# Patient Record
Sex: Female | Born: 1970
Health system: Southern US, Community
[De-identification: ages and names within clinical notes are randomized; demographics above are authoritative.]

## PROBLEM LIST (undated history)

## (undated) DIAGNOSIS — E079 Disorder of thyroid, unspecified: Secondary | ICD-10-CM

## (undated) DIAGNOSIS — E039 Hypothyroidism, unspecified: Secondary | ICD-10-CM

## (undated) DIAGNOSIS — D219 Benign neoplasm of connective and other soft tissue, unspecified: Secondary | ICD-10-CM

## (undated) DIAGNOSIS — I10 Essential (primary) hypertension: Secondary | ICD-10-CM

## (undated) HISTORY — DX: Hypothyroidism, unspecified: E03.9

## (undated) HISTORY — DX: Disorder of thyroid, unspecified: E07.9

## (undated) HISTORY — PX: KNEE ARTHROSCOPY: SUR90

## (undated) HISTORY — DX: Essential (primary) hypertension: I10

## (undated) HISTORY — PX: TONSILLECTOMY: SUR1361

## (undated) HISTORY — PX: DILATION AND CURETTAGE OF UTERUS: SHX78

---

## 1997-11-23 ENCOUNTER — Other Ambulatory Visit: Admission: RE | Admit: 1997-11-23 | Discharge: 1997-11-23 | Payer: Self-pay | Admitting: Obstetrics and Gynecology

## 1998-11-28 ENCOUNTER — Other Ambulatory Visit: Admission: RE | Admit: 1998-11-28 | Discharge: 1998-11-28 | Payer: Self-pay | Admitting: Obstetrics and Gynecology

## 2000-01-16 ENCOUNTER — Encounter: Admission: RE | Admit: 2000-01-16 | Discharge: 2000-01-16 | Payer: Self-pay | Admitting: Family Medicine

## 2000-01-16 ENCOUNTER — Encounter: Payer: Self-pay | Admitting: Family Medicine

## 2000-05-14 ENCOUNTER — Other Ambulatory Visit: Admission: RE | Admit: 2000-05-14 | Discharge: 2000-05-14 | Payer: Self-pay | Admitting: Gynecology

## 2001-01-19 ENCOUNTER — Inpatient Hospital Stay (HOSPITAL_COMMUNITY): Admission: AD | Admit: 2001-01-19 | Discharge: 2001-01-21 | Payer: Self-pay | Admitting: *Deleted

## 2001-01-24 ENCOUNTER — Encounter: Admission: RE | Admit: 2001-01-24 | Discharge: 2001-02-23 | Payer: Self-pay | Admitting: *Deleted

## 2001-02-28 ENCOUNTER — Other Ambulatory Visit: Admission: RE | Admit: 2001-02-28 | Discharge: 2001-02-28 | Payer: Self-pay | Admitting: *Deleted

## 2002-04-03 ENCOUNTER — Other Ambulatory Visit: Admission: RE | Admit: 2002-04-03 | Discharge: 2002-04-03 | Payer: Self-pay | Admitting: *Deleted

## 2002-04-21 ENCOUNTER — Encounter: Payer: Self-pay | Admitting: Family Medicine

## 2002-04-21 ENCOUNTER — Encounter: Admission: RE | Admit: 2002-04-21 | Discharge: 2002-04-21 | Payer: Self-pay | Admitting: Family Medicine

## 2002-08-27 ENCOUNTER — Encounter: Admission: RE | Admit: 2002-08-27 | Discharge: 2002-11-25 | Payer: Self-pay | Admitting: Family Medicine

## 2002-11-17 ENCOUNTER — Other Ambulatory Visit: Admission: RE | Admit: 2002-11-17 | Discharge: 2002-11-17 | Payer: Self-pay | Admitting: Obstetrics and Gynecology

## 2003-05-26 ENCOUNTER — Inpatient Hospital Stay (HOSPITAL_COMMUNITY): Admission: AD | Admit: 2003-05-26 | Discharge: 2003-05-28 | Payer: Self-pay | Admitting: Obstetrics and Gynecology

## 2003-05-29 ENCOUNTER — Inpatient Hospital Stay (HOSPITAL_COMMUNITY): Admission: AD | Admit: 2003-05-29 | Discharge: 2003-05-29 | Payer: Self-pay | Admitting: Obstetrics and Gynecology

## 2003-05-31 ENCOUNTER — Encounter: Admission: RE | Admit: 2003-05-31 | Discharge: 2003-06-30 | Payer: Self-pay | Admitting: Obstetrics and Gynecology

## 2003-06-21 ENCOUNTER — Encounter: Admission: RE | Admit: 2003-06-21 | Discharge: 2003-09-19 | Payer: Self-pay | Admitting: Family Medicine

## 2003-07-13 ENCOUNTER — Other Ambulatory Visit: Admission: RE | Admit: 2003-07-13 | Discharge: 2003-07-13 | Payer: Self-pay | Admitting: Obstetrics and Gynecology

## 2003-12-29 ENCOUNTER — Encounter: Admission: RE | Admit: 2003-12-29 | Discharge: 2004-01-12 | Payer: Self-pay | Admitting: Family Medicine

## 2004-05-03 ENCOUNTER — Emergency Department (HOSPITAL_COMMUNITY): Admission: EM | Admit: 2004-05-03 | Discharge: 2004-05-03 | Payer: Self-pay | Admitting: Emergency Medicine

## 2004-07-20 ENCOUNTER — Other Ambulatory Visit: Admission: RE | Admit: 2004-07-20 | Discharge: 2004-07-20 | Payer: Self-pay | Admitting: Obstetrics and Gynecology

## 2005-03-28 ENCOUNTER — Other Ambulatory Visit: Admission: RE | Admit: 2005-03-28 | Discharge: 2005-03-28 | Payer: Self-pay | Admitting: Obstetrics and Gynecology

## 2005-04-25 ENCOUNTER — Inpatient Hospital Stay (HOSPITAL_COMMUNITY): Admission: AD | Admit: 2005-04-25 | Discharge: 2005-04-25 | Payer: Self-pay | Admitting: Obstetrics and Gynecology

## 2006-06-06 ENCOUNTER — Inpatient Hospital Stay (HOSPITAL_COMMUNITY): Admission: RE | Admit: 2006-06-06 | Discharge: 2006-06-08 | Payer: Self-pay | Admitting: Obstetrics and Gynecology

## 2008-02-24 ENCOUNTER — Ambulatory Visit: Payer: Self-pay | Admitting: Internal Medicine

## 2008-04-08 ENCOUNTER — Ambulatory Visit: Payer: Self-pay | Admitting: Internal Medicine

## 2008-11-01 ENCOUNTER — Ambulatory Visit: Payer: Self-pay | Admitting: Internal Medicine

## 2010-08-04 NOTE — Discharge Summary (Signed)
[facility]  Patient:    Jane Avila, Jane Avila Visit Number: [id number] MRN: [id number]          Service Type: OBS Location: 910A [id number] [id number] Attending Physician:  Wetzel Bjornstad Dictated by:   Antony Contras, [facility] Admit Date:  01/19/2001 Discharge Date: 01/21/2001                             Discharge Summary  DISCHARGE DIAGNOSES:          1. Intrauterine pregnancy at term.                               2. Spontaneous onset of labor.  PROCEDURE:                    Normal spontaneous vaginal delivery of viable infant over second degree episiotomy.  HISTORY OF PRESENT ILLNESS:   The patient is a 40 year old primigravida with an LMP of April 24, 2000, [facility] January 29, 2001.  Prenatal course was complicated by a borderline AFP.  She did have an amniocentesis which revealed a chromosomally normal fetus.  LABORATORY DATA:              Blood type B positive, antibody screen negative. RPR, HBSAG, and HIV nonreactive.  Tox screen negative.  HOSPITAL COURSE:              The patient presented on January 19, 2001, with spontaneous onset of labor.  She progressed to complete dilatation and delivered an Apgars 9 and 69 female infant weighing 7 pounds 7 ounces over midline episiotomy.  Postpartum course was uncomplicated.  She remained afebrile, had no difficulty voiding, and was able to be discharged on the second postpartum day in satisfactory condition.  CBC; hematocrit 32.5, hemoglobin 11.3, WBC 22.8, platelets 285.  DISPOSITION:                  Follow up in six weeks, continue prenatal vitamins and iron.  Motrin and Tylox for pain. Dictated by:   Antony Contras, [facility] Attending Physician:  Wetzel Bjornstad DD:  01/31/01 TD:  01/31/01 Job: [id number]

## 2010-08-04 NOTE — H&P (Signed)
Jane Avila, PICCININNI          ACCOUNT NO.:  [id number]   MEDICAL RECORD NO.:  [id number]          PATIENT TYPE:  INP   LOCATION:  9175                          FACILITY:  [facility]   PHYSICIAN:  Lenoard Aden, M.D.DATE OF BIRTH:  1970-10-27   DATE OF ADMISSION:  06/06/2006  DATE OF DISCHARGE:                              HISTORY & PHYSICAL   INDICATION FOR INDUCTION:  History of precipitous labor, advanced  cervical dilatation, history of second trimester termination for  congenital anomalies with last labor being approximately 20 minutes.   She is a 40 year old Hispanic female, G4, P2, at [redacted] weeks gestation who  presents with 5 cm dilatation, history of precipitous labor, for  induction.   PAST MEDICAL HISTORY:  1. Remarkable for 2 uncomplicated vaginal deliveries.  One second      trimester termination for anomalies.  One spontaneous abortion.  2. History of knee surgery.  3. Migraine headaches.   SOCIAL HISTORY:  She is a nonsmoker, nondrinker.  Denies domestic or  physical violence.   MEDICATIONS:  1. Prenatal vitamins.  2. Tums.  3. Maalox.  4. Zantac.   She was previously hypothyroid with normal TSH this pregnancy.  Currently on no medications.   FAMILY HISTORY:  Father with diabetes, hypertension and skin cancer.  Family history of urolithiasis.   PRENATAL COURSE:  Otherwise uncomplicated.   PHYSICAL EXAMINATION:  GENERAL:  She is a well-developed, well-nourished  Hispanic female in no acute distress.  HEENT:  Normal.  LUNGS:  Clear.  HEART:  Regular rhythm.  ABDOMEN:  Soft, gravid, nontender.  Estimated fetal weight 7 to 7.5  pounds.  PELVIC:  Cervix is 5 cm, 60%, vertex -1.  Amniotomy of clear fluid.  EXTREMITIES:  No cords.  NEUROLOGIC:  Nonfocal.  No CVA tenderness noted.  SKIN:  Intact.   NST is reactive.  Contractions are irregular at this time.   IMPRESSION:  1. A 38-week OB.  2. History of second trimester termination for multiple  anomalies.  3. History of precipitous labor.  4. Advanced cervical dilatation.   PLAN:  Proceed with induction.  Risks and benefits with small risk of  prematurity noted.  The patient acknowledges and will proceed.      Lenoard Aden, M.D.  Electronically Signed     RJT/MEDQ  D:  06/06/2006  T:  06/06/2006  Job:  [id number]

## 2010-08-04 NOTE — H&P (Signed)
[facility]  Patient:    Jane Avila, Jane Avila Visit Number: [id number] MRN: [id number]          Service Type: Attending:  Katy Fitch, M.D. Dictated by:   Katy Fitch, M.D. Adm. Date:  01/19/01                           History and Physical  CHIEF COMPLAINT:                Labor.  HISTORY OF PRESENT ILLNESS:     The patient is a 40 year old G1 at [redacted] weeks gestation who comes into triage complaining of contractions every three minutes for the past three hours.  The patient denies any fevers, vomiting, change in bowel or bladder habits or rupture of membranes.  The patient was checked in triage and noted to be 2-3 cm, 100% effaced and +2 station, contracting every two to three minutes on tocometer with a reactive strip in the 150s.  The patient was then admitted to labor and delivery.  PAST MEDICAL HISTORY:           None.  PAST SURGICAL HISTORY:          Knee surgery.  MEDICATIONS:                    Prenatal vitamins.  ALLERGIES:                      None.  SOCIAL HISTORY:                 She denies any alcohol, tobacco or drugs.  FAMILY HISTORY:                 Without any mental retardation or epithelial cancers.  PRENATAL LABORATORY DATA:       B positive, rubella immune.  PHYSICAL EXAMINATION:  VITAL SIGNS:                    Blood pressure 127/80.  HEENT:                          Clear.  LUNGS:                          Clear to auscultation bilaterally.  HEART:                          Regular rate and rhythm.  ABDOMEN:                        Gravid, nontender.  Estimated fetal weight 7 pounds, 8 ounces.  EXTREMITIES:                    Without clubbing, cyanosis, or tenderness.  PELVIC:                         Cervix 2-3, 100%, -2.  Tocolysis q.2-3.  Fetal heart tones 150s, reactive.  ASSESSMENT AND PLAN:            This is a 41 year old G1 at 17 weeks in latent labor.  We will admit for labor.  The patient will is  GBS negative and will not need penicillin.  We will give IV Stadol for  pain relief and then epidural as needed. Dictated by:   Katy Fitch, M.D. Attending:  Katy Fitch, M.D. DD:  01/19/01 TD:  01/19/01 Job: [id number]

## 2010-09-22 ENCOUNTER — Encounter: Payer: Self-pay | Admitting: Internal Medicine

## 2010-09-22 ENCOUNTER — Ambulatory Visit (INDEPENDENT_AMBULATORY_CARE_PROVIDER_SITE_OTHER): Payer: Commercial Managed Care - PPO | Admitting: Internal Medicine

## 2010-09-22 DIAGNOSIS — IMO0002 Reserved for concepts with insufficient information to code with codable children: Secondary | ICD-10-CM

## 2010-09-22 DIAGNOSIS — W278XXA Contact with other nonpowered hand tool, initial encounter: Secondary | ICD-10-CM

## 2010-09-22 DIAGNOSIS — F419 Anxiety disorder, unspecified: Secondary | ICD-10-CM

## 2010-09-22 DIAGNOSIS — E049 Nontoxic goiter, unspecified: Secondary | ICD-10-CM

## 2010-09-22 DIAGNOSIS — E039 Hypothyroidism, unspecified: Secondary | ICD-10-CM

## 2010-09-22 DIAGNOSIS — F411 Generalized anxiety disorder: Secondary | ICD-10-CM

## 2010-09-22 NOTE — Progress Notes (Signed)
  Subjective:    Patient ID: Jane Avila, female    DOB: 1970/04/23, 40 y.o.   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 Apparently went to put a used syringe with needle attached into the sharps container and accidently stuck herself superficially in the finger. There is some question about a possible exposure to Hepatitis C. Employee Health at [facility] and an Infectious Disease specialist in [facility] have been involved with the case. Jane Avila still has a number of questions about this incident. Unfortunately, Dr. Dorena Bodo Disease specialist, is on vacation this week and could not be reached. I did call Dr. Lollie Sails, Chief Medical Officer, who did investigate the matter. Jane Avila is to report next week for repeat testing. They are trying to contact the patient that was in the Emergency Department for some further testing as well. Total amount of time spent on this matter today was one hour. This included making multiple phone calls to [facility], Dr. Lollie Sails, [facility], and to Dr. Uvaldo Bristle office. Patient was counseled regarding her needlestick exposure. Currently she is having considerable anxiety regarding the issue. Have given her prescription for Xanax 0.25 mg #60 one half to one by mouth twice a day for anxiety. I later contacted her by telephone and relayed to her what  Dr. Mayford Knife had explained to me was the present situation. He assures me he will followup with this issue next week. I have told the patient to keep her followup appointment this coming Tuesday, July 10 at [facility] at [facility] Dr. Mayford Knife assures me that the patient was HIV negative. Jane Avila states she has had the hepatitis B series and states she was told she has a positive  titer for hepatitis B . The remaining question is whether or not the patient she was exposed to has active hepatitis C.    Review of Systems     Objective:   Physical Exam patient is alert and oriented x3. She is anxious in relaying all of this information to me today        Assessment & Plan:  Anxiety related to needlestick in emergency department  Plan: Xanax 0.25 mg #60 one half to one by mouth twice a day when necessary anxiety

## 2010-09-27 ENCOUNTER — Telehealth: Payer: Self-pay | Admitting: Internal Medicine

## 2010-09-27 NOTE — Telephone Encounter (Signed)
Have spoken with [name] today as well as Dr. 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 Further testing has been done this past weekend on pt she was exposed to  and results are being sent to Employee Health Bertell Maria) according to [place], ED Lawyer. Dr Mayford Knife says [name] will be back tomorrow and he will have her call [facility]. I have suggested to Dr. Mayford Knife that [name] and [name] have a face to face meeting to resolve any questions Jane Avila has. Jane Avila says she was denied Radiographer, therapeutic for this exposure and is frustrated with how it has been handled by the hospital. She has asked about Interferon treatment which is something [name] needs to discuss with her. Jane Avila says she herself went for repeat testing yesterday. There is concern that Jane Avila was exposed to a patient with active Hepatitis C.

## 2010-09-29 ENCOUNTER — Telehealth: Payer: Self-pay | Admitting: Internal Medicine

## 2010-10-02 NOTE — Telephone Encounter (Signed)
Pt was given Dr. Uvaldo Bristle cell phone number to contact her.

## 2011-09-28 ENCOUNTER — Other Ambulatory Visit: Payer: BC Managed Care – PPO | Admitting: Internal Medicine

## 2011-09-28 DIAGNOSIS — Z0000 Encounter for general adult medical examination without abnormal findings: Secondary | ICD-10-CM

## 2011-09-28 DIAGNOSIS — E039 Hypothyroidism, unspecified: Secondary | ICD-10-CM

## 2011-09-28 LAB — CBC WITH DIFFERENTIAL/PLATELET
Basophils Absolute: 0 10*3/uL (ref 0.0–0.1)
Basophils Relative: 1 % (ref 0–1)
Eosinophils Absolute: 0.1 10*3/uL (ref 0.0–0.7)
Eosinophils Relative: 1 % (ref 0–5)
HCT: 42.7 % (ref 36.0–46.0)
Hemoglobin: 14.7 g/dL (ref 12.0–15.0)
Lymphocytes Relative: 27 % (ref 12–46)
Lymphs Abs: 1.5 10*3/uL (ref 0.7–4.0)
MCH: 30.1 pg (ref 26.0–34.0)
MCHC: 34.4 g/dL (ref 30.0–36.0)
MCV: 87.3 fL (ref 78.0–100.0)
Monocytes Absolute: 0.5 10*3/uL (ref 0.1–1.0)
Monocytes Relative: 10 % (ref 3–12)
Neutro Abs: 3.3 10*3/uL (ref 1.7–7.7)
Neutrophils Relative %: 61 % (ref 43–77)
Platelets: 269 10*3/uL (ref 150–400)
RBC: 4.89 MIL/uL (ref 3.87–5.11)
RDW: 13.5 % (ref 11.5–15.5)
WBC: 5.4 10*3/uL (ref 4.0–10.5)

## 2011-09-28 LAB — COMPREHENSIVE METABOLIC PANEL
ALT: 11 U/L (ref 0–35)
AST: 16 U/L (ref 0–37)
Albumin: 4.4 g/dL (ref 3.5–5.2)
Alkaline Phosphatase: 57 U/L (ref 39–117)
BUN: 15 mg/dL (ref 6–23)
CO2: 26 mEq/L (ref 19–32)
Calcium: 9 mg/dL (ref 8.4–10.5)
Chloride: 107 mEq/L (ref 96–112)
Creat: 0.81 mg/dL (ref 0.50–1.10)
Glucose, Bld: 78 mg/dL (ref 70–99)
Potassium: 4 mEq/L (ref 3.5–5.3)
Sodium: 140 mEq/L (ref 135–145)
Total Bilirubin: 0.8 mg/dL (ref 0.3–1.2)
Total Protein: 7 g/dL (ref 6.0–8.3)

## 2011-09-28 LAB — LIPID PANEL
Cholesterol: 167 mg/dL (ref 0–200)
HDL: 53 mg/dL (ref >39)
LDL Cholesterol: 104 mg/dL — ABNORMAL HIGH (ref 0–99)
Total CHOL/HDL Ratio: 3.2 Ratio
Triglycerides: 51 mg/dL (ref <150)
VLDL: 10 mg/dL (ref 0–40)

## 2011-09-28 LAB — TSH: TSH: 3.294 u[IU]/mL (ref 0.350–4.500)

## 2011-09-29 LAB — VITAMIN D 25 HYDROXY (VIT D DEFICIENCY, FRACTURES): Vit D, 25-Hydroxy: 33 ng/mL (ref 30–89)

## 2011-10-02 ENCOUNTER — Ambulatory Visit (INDEPENDENT_AMBULATORY_CARE_PROVIDER_SITE_OTHER): Payer: BC Managed Care – PPO | Admitting: Internal Medicine

## 2011-10-02 ENCOUNTER — Encounter: Payer: Self-pay | Admitting: Internal Medicine

## 2011-10-02 VITALS — BP 122/70 | HR 72 | Temp 98.7°F | Ht 63.25 in | Wt 153.0 lb

## 2011-10-02 DIAGNOSIS — Z8639 Personal history of other endocrine, nutritional and metabolic disease: Secondary | ICD-10-CM

## 2011-10-02 DIAGNOSIS — Z8669 Personal history of other diseases of the nervous system and sense organs: Secondary | ICD-10-CM

## 2011-10-02 DIAGNOSIS — Z0000 Encounter for general adult medical examination without abnormal findings: Secondary | ICD-10-CM

## 2011-10-02 DIAGNOSIS — Z862 Personal history of diseases of the blood and blood-forming organs and certain disorders involving the immune mechanism: Secondary | ICD-10-CM

## 2011-10-02 DIAGNOSIS — E039 Hypothyroidism, unspecified: Secondary | ICD-10-CM

## 2011-10-02 LAB — POCT URINALYSIS DIPSTICK
Bilirubin, UA: NEGATIVE
Blood, UA: NEGATIVE
Glucose, UA: NEGATIVE
Ketones, UA: NEGATIVE
Leukocytes, UA: NEGATIVE
Nitrite, UA: NEGATIVE
Protein, UA: NEGATIVE
Spec Grav, UA: 1.01
Urobilinogen, UA: NEGATIVE
pH, UA: 6

## 2011-10-18 ENCOUNTER — Encounter: Payer: Self-pay | Admitting: Internal Medicine

## 2011-10-18 NOTE — Patient Instructions (Addendum)
Continue thyroid replacement per Dr. Leslie Dales. Return in one year

## 2011-10-18 NOTE — Progress Notes (Signed)
  Subjective:    Patient ID: Jane Avila, female    DOB: 04/16/1970, 41 y.o.   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 Leslie Dales for health maintenance exam. Diagnosed with postpartum thyroiditis in 2005. Anti-TPO antibody strongly positive. TSH went up to 28.171 by September 2005. Was started on Synthroid then. Second pregnancy was lost at 20 weeks in March 2007. Has been off Synthroid until recently. Third pregnancy apparently had normal thyroid levels and delivered March 2008. She is a special needs child.  Knee surgery in 1989 and 1990.  Family history: Father with history of diabetes and skin cancer as well as hypertension. Mother with history of hypertension.  Social history: married husband works for [employer]; nonsmoker, social alcohol consumption. Works at both [employer] and [employer]    Review of Systems  Constitutional: Negative.   All other systems reviewed and are negative.        Objective:   Physical Exam  Constitutional: She is oriented to person, place, and time. She appears well-developed and well-nourished. No distress.  HENT:  Head: Normocephalic and atraumatic.  Right Ear: External ear normal.  Left Ear: External ear normal.  Mouth/Throat: Oropharynx is clear and moist.  Eyes: Conjunctivae and EOM are normal. Pupils are equal, round, and reactive to light. Right eye exhibits no discharge. Left eye exhibits no discharge. No scleral icterus.  Neck: Neck supple. No JVD present. No thyromegaly present.  Cardiovascular: Normal rate, regular rhythm, normal heart sounds and intact distal pulses.   No murmur heard. Pulmonary/Chest: Effort normal and breath sounds normal. She has no rales.  Abdominal: Soft. Bowel sounds are normal. She exhibits no mass. There is no tenderness. There is no rebound.  Musculoskeletal: Normal range of motion. She exhibits  no edema.  Lymphadenopathy:    She has no cervical adenopathy.  Neurological: She is alert and oriented to person, place, and time. She has normal reflexes. No cranial nerve deficit. Coordination normal.  Skin: Skin is warm and dry. No rash noted. She is not diaphoretic.  Psychiatric: She has a normal mood and affect. Her behavior is normal. Judgment and thought content normal.          Assessment & Plan:  History of thyroiditis postpartum with subsequent hypothyroidism. Subsequently developed normal TSH and was taken off of Synthroid. Now recently put back on Synthroid by Dr. Leslie Dales.  History of migraine headaches  Plan: Return one year or as needed.

## 2012-01-22 ENCOUNTER — Other Ambulatory Visit: Payer: Self-pay | Admitting: Obstetrics and Gynecology

## 2012-01-22 DIAGNOSIS — Z1231 Encounter for screening mammogram for malignant neoplasm of breast: Secondary | ICD-10-CM

## 2012-03-05 ENCOUNTER — Ambulatory Visit
Admission: RE | Admit: 2012-03-05 | Discharge: 2012-03-05 | Disposition: A | Discharge status: Home / Self Care | Payer: BC Managed Care – PPO | Source: Ambulatory Visit | Attending: Obstetrics and Gynecology | Admitting: Obstetrics and Gynecology

## 2012-03-05 DIAGNOSIS — Z1231 Encounter for screening mammogram for malignant neoplasm of breast: Secondary | ICD-10-CM

## 2012-10-09 ENCOUNTER — Ambulatory Visit (INDEPENDENT_AMBULATORY_CARE_PROVIDER_SITE_OTHER): Payer: BC Managed Care – PPO | Admitting: Internal Medicine

## 2012-10-09 ENCOUNTER — Encounter: Payer: Self-pay | Admitting: Internal Medicine

## 2012-10-09 VITALS — BP 126/86 | Temp 98.7°F | Wt 164.0 lb

## 2012-10-09 DIAGNOSIS — F988 Other specified behavioral and emotional disorders with onset usually occurring in childhood and adolescence: Secondary | ICD-10-CM

## 2012-10-09 MED ORDER — AMPHETAMINE-DEXTROAMPHETAMINE 15 MG PO TABS: 15.0000 mg | ORAL_TABLET | Freq: Two times a day (BID) | ORAL | Status: DC

## 2012-10-09 MED ORDER — AMPHETAMINE-DEXTROAMPHETAMINE 15 MG PO TABS: 15.0000 mg | ORAL_TABLET | Freq: Every day | ORAL | Status: DC

## 2012-10-09 MED ORDER — CLONAZEPAM 0.5 MG PO TABS: 0.5000 mg | ORAL_TABLET | Freq: Every evening | ORAL | Status: DC | PRN

## 2012-10-09 NOTE — Patient Instructions (Addendum)
Take Adderall 15 mg  for studying. Take Klonopin 0.05 mg  As needed for insomnia.

## 2012-10-15 DIAGNOSIS — F988 Other specified behavioral and emotional disorders with onset usually occurring in childhood and adolescence: Secondary | ICD-10-CM | POA: Insufficient documentation

## 2012-10-15 NOTE — Progress Notes (Signed)
  Subjective:    Patient ID: Jane Avila, female    DOB: 1970/07/01, 42 y.o.   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 She has a history of some mild attention deficit and took Ritalin when she was in school previously several years ago around 2009. She has a history of hypothyroidism and migraine headaches. GYN is Dr. to have on. She had an IUD placed in 2013.    Review of Systems     Objective:   Physical Exam spent 15 minutes with the patient talking about attention deficit issues. Feels that she needs something to help her focus. She has 3 children at home and felt a Ritalin helped her a great deal when she was in nursing school.       Assessment & Plan:  Mild attention deficit disorder  Plan: Try Adderall 15 mg twice daily. She has appointment for physical exam here in the near future.  Prior authorization needed  Addendum: Prior authorization received

## 2012-11-03 ENCOUNTER — Encounter: Payer: Self-pay | Admitting: Internal Medicine

## 2013-02-11 LAB — HM PAP SMEAR

## 2013-03-27 ENCOUNTER — Other Ambulatory Visit: Payer: Self-pay

## 2013-03-27 DIAGNOSIS — Z1231 Encounter for screening mammogram for malignant neoplasm of breast: Secondary | ICD-10-CM

## 2013-03-31 ENCOUNTER — Ambulatory Visit
Admission: RE | Admit: 2013-03-31 | Discharge: 2013-03-31 | Disposition: A | Discharge status: Home / Self Care | Payer: BC Managed Care – PPO | Source: Ambulatory Visit

## 2013-03-31 DIAGNOSIS — Z1231 Encounter for screening mammogram for malignant neoplasm of breast: Secondary | ICD-10-CM

## 2013-07-07 ENCOUNTER — Telehealth: Payer: Self-pay | Admitting: Internal Medicine

## 2013-07-07 ENCOUNTER — Other Ambulatory Visit: Payer: Self-pay

## 2013-07-07 MED ORDER — AMPHETAMINE-DEXTROAMPHETAMINE 15 MG PO TABS: 15.0000 mg | ORAL_TABLET | Freq: Two times a day (BID) | ORAL | Status: DC

## 2013-07-07 NOTE — Telephone Encounter (Signed)
Refill x 3 months. 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

## 2013-07-15 DIAGNOSIS — F988 Other specified behavioral and emotional disorders with onset usually occurring in childhood and adolescence: Secondary | ICD-10-CM

## 2014-02-22 ENCOUNTER — Other Ambulatory Visit: Payer: Self-pay | Admitting: Obstetrics and Gynecology

## 2014-02-24 ENCOUNTER — Encounter (HOSPITAL_COMMUNITY)
Admission: RE | Admit: 2014-02-24 | Discharge: 2014-02-24 | Disposition: A | Discharge status: Home / Self Care | Payer: BC Managed Care – PPO | Source: Ambulatory Visit | Attending: Obstetrics and Gynecology | Admitting: Obstetrics and Gynecology

## 2014-02-24 ENCOUNTER — Encounter (HOSPITAL_COMMUNITY): Payer: Self-pay

## 2014-02-24 DIAGNOSIS — Z01812 Encounter for preprocedural laboratory examination: Secondary | ICD-10-CM | POA: Diagnosis not present

## 2014-02-24 LAB — CBC
HCT: 43.4 % (ref 36.0–46.0)
Hemoglobin: 14.4 g/dL (ref 12.0–15.0)
MCH: 29.8 pg (ref 26.0–34.0)
MCHC: 33.2 g/dL (ref 30.0–36.0)
MCV: 89.9 fL (ref 78.0–100.0)
Platelets: 282 10*3/uL (ref 150–400)
RBC: 4.83 MIL/uL (ref 3.87–5.11)
RDW: 13.8 % (ref 11.5–15.5)
WBC: 8.6 10*3/uL (ref 4.0–10.5)

## 2014-02-24 LAB — BASIC METABOLIC PANEL
Anion gap: 12 (ref 5–15)
BUN: 16 mg/dL (ref 6–23)
CO2: 28 mEq/L (ref 19–32)
Calcium: 9.6 mg/dL (ref 8.4–10.5)
Chloride: 100 mEq/L (ref 96–112)
Creatinine, Ser: 0.76 mg/dL (ref 0.50–1.10)
GFR calc Af Amer: >90 mL/min (ref >90)
GFR calc non Af Amer: >90 mL/min (ref >90)
Glucose, Bld: 106 mg/dL — ABNORMAL HIGH (ref 70–99)
Potassium: 4 mEq/L (ref 3.7–5.3)
Sodium: 140 mEq/L (ref 137–147)

## 2014-02-24 NOTE — Patient Instructions (Addendum)
   Your procedure is scheduled on: Mar 01 2014 AT 1PM  Enter through the Main Entrance of [facility] at: Dawn up the phone at the desk and dial [phone] and inform us of your arrival.  Please call this number if you have any problems the morning of surgery: [phone]  Remember: Do not eat food after midnight: DEC 13 Do not drink clear liquids after: 9AM ON DEC 14 Take these medicines the morning of surgery with a SIP OF WATER: TAKE SYNTHROID AM OF SURGERY  Do not wear jewelry, make-up, or FINGER nail polish No metal in your hair or on your body. Do not wear lotions, powders, perfumes.  You may wear deodorant.  Do not bring valuables to the hospital. Contacts, dentures or bridgework may not be worn into surgery.  Leave suitcase in the car. After Surgery it may be brought to your room. For patients being admitted to the hospital, checkout time is 11:00am the day of discharge.    Patients discharged on the day of surgery will not be allowed to drive home.

## 2014-02-28 NOTE — H&P (Signed)
NAMEMarland Kitchen  Jane Avila, Jane Avila NO.:  [id number]  MEDICAL RECORD NO.:  [id number]  LOCATION:  PERIO                         FACILITY:  [facility]  PHYSICIAN:  Lovenia Kim, M.D.DATE OF BIRTH:  1970/04/30  DATE OF ADMISSION:  11/26/2013 DATE OF DISCHARGE:                             HISTORY & [facility] outpatient surgery on March 01, 2014.  CHIEF COMPLAINT:  Symptomatic fibroids.  HISTORY OF PRESENT ILLNESS:  She is a 43 year old white female, G4, P2 who presents with symptomatic fibroids, questionable submucosal component with change in bleeding patterns and the Mirena IUD for definitive therapy.  MEDICATIONS:  Include Mirena, Synthroid,   ALLERGIES:  She has allergies to SULFA, ERYTHROMYCIN, AND TETRACYCLINE.  FAMILY HISTORY:  Skin cancer, diabetes, heart murmur, kidney stones, and hypertension.  She has a history of 1 pregnancy loss and 2 vaginal deliveries and 1 termination at 20 weeks for fetal anomaly.  SURGICAL HISTORY:  Includes a D and E as noted and knee surgery in 1989 and 1991.  PHYSICAL EXAMINATION:  GENERAL:  She is a well-developed, well- nourished, white female in no acute distress. HEENT:  Normal. NECK:  Supple.  Full range of motion. LUNGS:  Clear to auscultation. ABDOMEN:  Soft, scaphoid, and nontender. PELVIC:  Reveals the uterus to be enlarged, 10-12 weeks.  No adnexal masses noted. EXTREMITIES:  There are no cords. NEUROLOGIC:  Nonfocal. SKIN:  Intact.  IMPRESSION:  Symptomatic fibroids with pelvic pain and dysmenorrhea, on IUD.  PLAN:  To proceed with definitive therapy in the form of da Vinci total laparoscopic hysterectomy, bilateral salpingectomy.  Risks of anesthesia, infection, bleeding, injury to surrounding organs, possible need for repair were discussed.  Possible need for morcellation has been noted and morcellation guidelines have been discussed prior to Surgery. THe patient has not indication for  endometrial sampling, recent normal pap smear, and current sonogram imaging. The patient acknowledges these issues.  She is aware of recent FDA mandates and recent statements regarding morcellation use and the rare incidents of leiomyosarcoma.  She acknowledges these risks and desires to proceed.     Lovenia Kim, M.D.     RJT/MEDQ  D:  02/28/2014  T:  02/28/2014  Job:  [id number]

## 2014-03-01 ENCOUNTER — Ambulatory Visit (HOSPITAL_COMMUNITY): Payer: BC Managed Care – PPO | Admitting: Anesthesiology

## 2014-03-01 ENCOUNTER — Encounter (HOSPITAL_COMMUNITY): Payer: Self-pay | Admitting: *Deleted

## 2014-03-01 ENCOUNTER — Encounter (HOSPITAL_COMMUNITY)
Admission: RE | Disposition: A | Discharge status: Home / Self Care | Payer: Self-pay | Source: Ambulatory Visit | Attending: Obstetrics and Gynecology

## 2014-03-01 ENCOUNTER — Ambulatory Visit (HOSPITAL_COMMUNITY)
Admission: RE | Admit: 2014-03-01 | Discharge: 2014-03-02 | Disposition: A | Discharge status: Home / Self Care | Payer: BC Managed Care – PPO | Source: Ambulatory Visit | Attending: Obstetrics and Gynecology | Admitting: Obstetrics and Gynecology

## 2014-03-01 DIAGNOSIS — E039 Hypothyroidism, unspecified: Secondary | ICD-10-CM | POA: Diagnosis not present

## 2014-03-01 DIAGNOSIS — D259 Leiomyoma of uterus, unspecified: Principal | ICD-10-CM | POA: Insufficient documentation

## 2014-03-01 DIAGNOSIS — N938 Other specified abnormal uterine and vaginal bleeding: Secondary | ICD-10-CM

## 2014-03-01 DIAGNOSIS — Z882 Allergy status to sulfonamides status: Secondary | ICD-10-CM | POA: Diagnosis not present

## 2014-03-01 DIAGNOSIS — Z87891 Personal history of nicotine dependence: Secondary | ICD-10-CM | POA: Insufficient documentation

## 2014-03-01 HISTORY — PX: ROBOTIC ASSISTED TOTAL HYSTERECTOMY: SHX6085

## 2014-03-01 HISTORY — PX: BILATERAL SALPINGECTOMY: SHX5743

## 2014-03-01 LAB — HCG, SERUM, QUALITATIVE: Preg, Serum: NEGATIVE

## 2014-03-01 SURGERY — ROBOTIC ASSISTED TOTAL HYSTERECTOMY
Anesthesia: General | Site: Abdomen

## 2014-03-01 MED ORDER — FENTANYL CITRATE 0.05 MG/ML IJ SOLN
INTRAMUSCULAR | Status: AC
  Filled 2014-03-01: qty 5

## 2014-03-01 MED ORDER — KETOROLAC TROMETHAMINE 30 MG/ML IJ SOLN
INTRAMUSCULAR | Status: DC | PRN
  Administered 2014-03-01: 30 mg via INTRAVENOUS

## 2014-03-01 MED ORDER — LIDOCAINE HCL (CARDIAC) 20 MG/ML IV SOLN
INTRAVENOUS | Status: AC
  Filled 2014-03-01: qty 5

## 2014-03-01 MED ORDER — PROMETHAZINE HCL 25 MG/ML IJ SOLN: 6.2500 mg | INTRAMUSCULAR | Status: DC | PRN

## 2014-03-01 MED ORDER — SODIUM CHLORIDE 0.9 % IJ SOLN: 9.0000 mL | INTRAMUSCULAR | Status: DC | PRN

## 2014-03-01 MED ORDER — VASOPRESSIN 20 UNIT/ML IV SOLN
INTRAVENOUS | Status: AC
  Filled 2014-03-01: qty 1

## 2014-03-01 MED ORDER — KETOROLAC TROMETHAMINE 30 MG/ML IJ SOLN: 15.0000 mg | Freq: Once | INTRAMUSCULAR | Status: DC | PRN

## 2014-03-01 MED ORDER — SODIUM CHLORIDE 0.9 % IV SOLN
INTRAVENOUS | Status: DC | PRN
  Administered 2014-03-01: 60 mL

## 2014-03-01 MED ORDER — FENTANYL CITRATE 0.05 MG/ML IJ SOLN: 25.0000 ug | INTRAMUSCULAR | Status: DC | PRN

## 2014-03-01 MED ORDER — ACETAMINOPHEN 10 MG/ML IV SOLN
1000.0000 mg | Freq: Once | INTRAVENOUS | Status: AC
  Administered 2014-03-01: 1000 mg via INTRAVENOUS
  Filled 2014-03-01: qty 100

## 2014-03-01 MED ORDER — FENTANYL CITRATE 0.05 MG/ML IJ SOLN
INTRAMUSCULAR | Status: AC
  Filled 2014-03-01: qty 2

## 2014-03-01 MED ORDER — DIPHENHYDRAMINE HCL 50 MG/ML IJ SOLN: 12.5000 mg | Freq: Four times a day (QID) | INTRAMUSCULAR | Status: DC | PRN

## 2014-03-01 MED ORDER — PROPOFOL 10 MG/ML IV EMUL
INTRAVENOUS | Status: AC
  Filled 2014-03-01: qty 20

## 2014-03-01 MED ORDER — DEXAMETHASONE SODIUM PHOSPHATE 10 MG/ML IJ SOLN
INTRAMUSCULAR | Status: DC | PRN
  Administered 2014-03-01: 4 mg via INTRAVENOUS

## 2014-03-01 MED ORDER — NALOXONE HCL 0.4 MG/ML IJ SOLN: 0.4000 mg | INTRAMUSCULAR | Status: DC | PRN

## 2014-03-01 MED ORDER — CEFAZOLIN SODIUM-DEXTROSE 2-3 GM-% IV SOLR
INTRAVENOUS | Status: AC
Start: 2014-03-01 — End: 2014-03-01
  Filled 2014-03-01: qty 50

## 2014-03-01 MED ORDER — MEPERIDINE HCL 25 MG/ML IJ SOLN: 6.2500 mg | INTRAMUSCULAR | Status: DC | PRN

## 2014-03-01 MED ORDER — GLYCOPYRROLATE 0.2 MG/ML IJ SOLN
INTRAMUSCULAR | Status: AC
  Filled 2014-03-01: qty 3

## 2014-03-01 MED ORDER — MIDAZOLAM HCL 2 MG/2ML IJ SOLN
INTRAMUSCULAR | Status: DC | PRN
  Administered 2014-03-01: 2 mg via INTRAVENOUS

## 2014-03-01 MED ORDER — GLYCOPYRROLATE 0.2 MG/ML IJ SOLN
INTRAMUSCULAR | Status: DC | PRN
  Administered 2014-03-01: 0.6 mg via INTRAVENOUS
  Administered 2014-03-01: 0.2 mg via INTRAVENOUS

## 2014-03-01 MED ORDER — DEXAMETHASONE SODIUM PHOSPHATE 10 MG/ML IJ SOLN
INTRAMUSCULAR | Status: AC
  Filled 2014-03-01: qty 1

## 2014-03-01 MED ORDER — LEVOTHYROXINE SODIUM 88 MCG PO TABS
88.0000 ug | ORAL_TABLET | Freq: Every day | ORAL | Status: DC
  Administered 2014-03-02: 88 ug via ORAL
  Filled 2014-03-01: qty 1

## 2014-03-01 MED ORDER — TRAMADOL HCL 50 MG PO TABS: 50.0000 mg | ORAL_TABLET | Freq: Four times a day (QID) | ORAL | Status: DC | PRN

## 2014-03-01 MED ORDER — HYDROMORPHONE 0.3 MG/ML IV SOLN
INTRAVENOUS | Status: DC
  Administered 2014-03-01: 0.799 mg via INTRAVENOUS
  Administered 2014-03-01: 18:00:00 via INTRAVENOUS
  Administered 2014-03-02: 0.399 mg via INTRAVENOUS
  Administered 2014-03-02: 0.2 mg via INTRAVENOUS
  Administered 2014-03-02: 0.6 mg via INTRAVENOUS
  Filled 2014-03-01: qty 25

## 2014-03-01 MED ORDER — SODIUM CHLORIDE 0.9 % IJ SOLN
INTRAMUSCULAR | Status: AC
  Filled 2014-03-01: qty 100

## 2014-03-01 MED ORDER — SODIUM CHLORIDE 0.9 % IJ SOLN
INTRAMUSCULAR | Status: DC | PRN
  Administered 2014-03-01: 10 mL

## 2014-03-01 MED ORDER — HYDROMORPHONE HCL 1 MG/ML IJ SOLN
INTRAMUSCULAR | Status: AC
  Filled 2014-03-01: qty 1

## 2014-03-01 MED ORDER — NEOSTIGMINE METHYLSULFATE 10 MG/10ML IV SOLN
INTRAVENOUS | Status: DC | PRN
Start: 2014-03-01 — End: 2014-03-01
  Administered 2014-03-01: 4 mg via INTRAVENOUS

## 2014-03-01 MED ORDER — OXYCODONE-ACETAMINOPHEN 5-325 MG PO TABS
1.0000 | ORAL_TABLET | ORAL | Status: DC | PRN
  Administered 2014-03-02: 1 via ORAL
  Filled 2014-03-01: qty 1

## 2014-03-01 MED ORDER — SODIUM CHLORIDE 0.9 % IJ SOLN
INTRAMUSCULAR | Status: AC
  Filled 2014-03-01: qty 50

## 2014-03-01 MED ORDER — FENTANYL CITRATE 0.05 MG/ML IJ SOLN
INTRAMUSCULAR | Status: DC | PRN
  Administered 2014-03-01 (×2): 100 ug via INTRAVENOUS
  Administered 2014-03-01 (×3): 50 ug via INTRAVENOUS

## 2014-03-01 MED ORDER — ROCURONIUM BROMIDE 100 MG/10ML IV SOLN
INTRAVENOUS | Status: AC
  Filled 2014-03-01: qty 1

## 2014-03-01 MED ORDER — SCOPOLAMINE 1 MG/3DAYS TD PT72
1.0000 | MEDICATED_PATCH | Freq: Once | TRANSDERMAL | Status: DC
  Administered 2014-03-01: 1.5 mg via TRANSDERMAL

## 2014-03-01 MED ORDER — LACTATED RINGERS IR SOLN
Status: DC | PRN
  Administered 2014-03-01: 3000 mL

## 2014-03-01 MED ORDER — ONDANSETRON HCL 4 MG/2ML IJ SOLN: 4.0000 mg | Freq: Four times a day (QID) | INTRAMUSCULAR | Status: DC | PRN

## 2014-03-01 MED ORDER — LIDOCAINE HCL (CARDIAC) 20 MG/ML IV SOLN
INTRAVENOUS | Status: DC | PRN
  Administered 2014-03-01: 50 mg via INTRAVENOUS

## 2014-03-01 MED ORDER — HYDROMORPHONE HCL 1 MG/ML IJ SOLN
INTRAMUSCULAR | Status: DC | PRN
  Administered 2014-03-01: 1 mg via INTRAVENOUS

## 2014-03-01 MED ORDER — ONDANSETRON HCL 4 MG/2ML IJ SOLN
INTRAMUSCULAR | Status: DC | PRN
  Administered 2014-03-01: 4 mg via INTRAVENOUS

## 2014-03-01 MED ORDER — PROPOFOL 10 MG/ML IV BOLUS
INTRAVENOUS | Status: DC | PRN
  Administered 2014-03-01: 150 mg via INTRAVENOUS

## 2014-03-01 MED ORDER — DEXTROSE IN LACTATED RINGERS 5 % IV SOLN
INTRAVENOUS | Status: DC
  Administered 2014-03-01 – 2014-03-02 (×2): via INTRAVENOUS

## 2014-03-01 MED ORDER — DIPHENHYDRAMINE HCL 12.5 MG/5ML PO ELIX: 12.5000 mg | ORAL_SOLUTION | Freq: Four times a day (QID) | ORAL | Status: DC | PRN

## 2014-03-01 MED ORDER — LACTATED RINGERS IV SOLN
INTRAVENOUS | Status: DC
  Administered 2014-03-01 (×2): via INTRAVENOUS

## 2014-03-01 MED ORDER — ROPIVACAINE HCL 5 MG/ML IJ SOLN
INTRAMUSCULAR | Status: AC
  Filled 2014-03-01: qty 60

## 2014-03-01 MED ORDER — NEOSTIGMINE METHYLSULFATE 10 MG/10ML IV SOLN
INTRAVENOUS | Status: AC
  Filled 2014-03-01: qty 1

## 2014-03-01 MED ORDER — SCOPOLAMINE 1 MG/3DAYS TD PT72
MEDICATED_PATCH | TRANSDERMAL | Status: AC
  Filled 2014-03-01: qty 1

## 2014-03-01 MED ORDER — ROCURONIUM BROMIDE 100 MG/10ML IV SOLN
INTRAVENOUS | Status: DC | PRN
  Administered 2014-03-01 (×2): 20 mg via INTRAVENOUS
  Administered 2014-03-01: 10 mg via INTRAVENOUS
  Administered 2014-03-01: 50 mg via INTRAVENOUS

## 2014-03-01 MED ORDER — MIDAZOLAM HCL 2 MG/2ML IJ SOLN
INTRAMUSCULAR | Status: AC
  Filled 2014-03-01: qty 2

## 2014-03-01 MED ORDER — CEFAZOLIN SODIUM-DEXTROSE 2-3 GM-% IV SOLR
2.0000 g | INTRAVENOUS | Status: AC
  Administered 2014-03-01: 2 g via INTRAVENOUS

## 2014-03-01 MED ORDER — KETOROLAC TROMETHAMINE 30 MG/ML IJ SOLN
INTRAMUSCULAR | Status: AC
  Filled 2014-03-01: qty 1

## 2014-03-01 MED ORDER — ONDANSETRON HCL 4 MG/2ML IJ SOLN
INTRAMUSCULAR | Status: AC
  Filled 2014-03-01: qty 2

## 2014-03-01 SURGICAL SUPPLY — 53 items
CATH FOLEY 3WAY  5CC 16FR (CATHETERS) ×2
CATH FOLEY 3WAY 5CC 16FR (CATHETERS) ×2 IMPLANT
CLOTH BEACON ORANGE TIMEOUT ST (SAFETY) ×4 IMPLANT
CONT PATH 16OZ SNAP LID 3702 (MISCELLANEOUS) ×4 IMPLANT
COVER BACK TABLE 60X90IN (DRAPES) ×8 IMPLANT
COVER TIP SHEARS 8 DVNC (MISCELLANEOUS) ×2 IMPLANT
COVER TIP SHEARS 8MM DA VINCI (MISCELLANEOUS) ×2
DECANTER SPIKE VIAL GLASS SM (MISCELLANEOUS) ×16 IMPLANT
DRAPE WARM FLUID 44X44 (DRAPE) ×4 IMPLANT
DRSG OPSITE POSTOP 3X4 (GAUZE/BANDAGES/DRESSINGS) ×4 IMPLANT
DURAPREP 26ML APPLICATOR (WOUND CARE) ×4 IMPLANT
ELECT REM PT RETURN 9FT ADLT (ELECTROSURGICAL) ×4
ELECTRODE REM PT RTRN 9FT ADLT (ELECTROSURGICAL) ×2 IMPLANT
EVACUATOR SMOKE 8.L (FILTER) ×8 IMPLANT
GAUZE VASELINE 3X9 (GAUZE/BANDAGES/DRESSINGS) ×4 IMPLANT
GLOVE BIO SURGEON STRL SZ7.5 (GLOVE) ×8 IMPLANT
GLOVE BIOGEL PI IND STRL 7.0 (GLOVE) ×24 IMPLANT
GLOVE BIOGEL PI INDICATOR 7.0 (GLOVE) ×24
GLOVE ECLIPSE 6.5 STRL STRAW (GLOVE) ×8 IMPLANT
GLOVE SURG SS PI 7.0 STRL IVOR (GLOVE) ×32 IMPLANT
GOWN STRL REUS W/TWL LRG LVL3 (GOWN DISPOSABLE) ×40 IMPLANT
KIT ACCESSORY DA VINCI DISP (KITS) ×2
KIT ACCESSORY DVNC DISP (KITS) ×2 IMPLANT
LEGGING LITHOTOMY PAIR STRL (DRAPES) ×4 IMPLANT
LIQUID BAND (GAUZE/BANDAGES/DRESSINGS) ×4 IMPLANT
NEEDLE INSUFFLATION 14GA 120MM (NEEDLE) ×4 IMPLANT
OCCLUDER COLPOPNEUMO (BALLOONS) ×8 IMPLANT
PACK ROBOT WH (CUSTOM PROCEDURE TRAY) ×4 IMPLANT
PAD PREP 24X48 CUFFED NSTRL (MISCELLANEOUS) ×8 IMPLANT
PAD TRENDELENBURG POSITION (MISCELLANEOUS) ×4 IMPLANT
PROTECTOR NERVE ULNAR (MISCELLANEOUS) ×8 IMPLANT
SET IRRIG TUBING LAPAROSCOPIC (IRRIGATION / IRRIGATOR) ×4 IMPLANT
SUT DVC VLOC 180 0 12IN GS21 (SUTURE) ×4
SUT VIC AB 0 CT1 27 (SUTURE) ×8
SUT VIC AB 0 CT1 27XBRD ANBCTR (SUTURE) ×4 IMPLANT
SUT VICRYL 0 UR6 27IN ABS (SUTURE) ×4 IMPLANT
SUT VICRYL RAPIDE 4/0 PS 2 (SUTURE) ×8 IMPLANT
SUT VLOC 180 0 9IN  GS21 (SUTURE) ×2
SUT VLOC 180 0 9IN GS21 (SUTURE) ×2 IMPLANT
SUTURE DVC VLC 180 0 12IN GS21 (SUTURE) ×2 IMPLANT
SYR 50ML LL SCALE MARK (SYRINGE) ×4 IMPLANT
SYRINGE 10CC LL (SYRINGE) ×8 IMPLANT
TIP RUMI ORANGE 6.7MMX12CM (TIP) ×4 IMPLANT
TIP UTERINE 5.1X6CM LAV DISP (MISCELLANEOUS) IMPLANT
TIP UTERINE 6.7X10CM GRN DISP (MISCELLANEOUS) IMPLANT
TOWEL OR 17X24 6PK STRL BLUE (TOWEL DISPOSABLE) ×16 IMPLANT
TROCAR DISP BLADELESS 8 DVNC (TROCAR) ×2 IMPLANT
TROCAR DISP BLADELESS 8MM (TROCAR) ×2
TROCAR XCEL 12X100 BLDLESS (ENDOMECHANICALS) ×4 IMPLANT
TROCAR XCEL NON-BLD 5MMX100MML (ENDOMECHANICALS) ×4 IMPLANT
TROCAR Z-THREAD 12X150 (TROCAR) ×4 IMPLANT
WARMER LAPAROSCOPE (MISCELLANEOUS) ×4 IMPLANT
WATER STERILE IRR 1000ML POUR (IV SOLUTION) ×12 IMPLANT

## 2014-03-01 NOTE — Anesthesia Procedure Notes (Signed)
Procedure Name: Intubation Date/Time: 03/01/2014 1:10 PM Performed by: Jonna Munro Pre-anesthesia Checklist: Patient being monitored, Suction available, Emergency Drugs available, Patient identified and Timeout performed Patient Re-evaluated:Patient Re-evaluated prior to inductionOxygen Delivery Method: Circle system utilized Preoxygenation: Pre-oxygenation with 100% oxygen Intubation Type: IV induction Ventilation: Mask ventilation without difficulty Laryngoscope Size: Mac and 3 Grade View: Grade III Tube type: Oral Tube size: 7.0 mm Number of attempts: 2 Airway Equipment and Method: Stylet and Video-laryngoscopy Secured at: 22 cm Tube secured with: Tape Dental Injury: Teeth and Oropharynx as per pre-operative assessment  Difficulty Due To: Difficulty was anticipated and Difficult Airway- due to anterior larynx

## 2014-03-01 NOTE — Anesthesia Preprocedure Evaluation (Signed)
Anesthesia Evaluation  Patient identified by MRN, date of birth, ID band Patient awake    Reviewed: Allergy & Precautions, H&P , NPO status , Patient's Chart, lab work & pertinent test results, reviewed documented beta blocker date and time   History of Anesthesia Complications Negative for: history of anesthetic complications  Airway Mallampati: IV  TM Distance: >3 FB Neck ROM: full    Dental  (+) Teeth Intact   Pulmonary Recent URI  ("sinus thing" off and on for three months - dry cough, no fever.  took augmentin 6 weeks ago), former smoker,  breath sounds clear to auscultation  Pulmonary exam normal - wheezing      Cardiovascular negative cardio ROS  Rhythm:regular Rate:Normal     Neuro/Psych  Headaches, PSYCHIATRIC DISORDERS (ADD)    GI/Hepatic Neg liver ROS, Bowel prep,  Endo/Other  Hypothyroidism   Renal/GU negative Renal ROS  Female GU complaint     Musculoskeletal   Abdominal   Peds  Hematology negative hematology ROS (+)   Anesthesia Other Findings   Reproductive/Obstetrics negative OB ROS                             Anesthesia Physical Anesthesia Plan  ASA: II  Anesthesia Plan: General ETT   Post-op Pain Management:    Induction:   Airway Management Planned:   Additional Equipment:   Intra-op Plan:   Post-operative Plan:   Informed Consent: I have reviewed the patients History and Physical, chart, labs and discussed the procedure including the risks, benefits and alternatives for the proposed anesthesia with the patient or authorized representative who has indicated his/her understanding and acceptance.   Dental Advisory Given  Plan Discussed with: CRNA and Surgeon  Anesthesia Plan Comments:         Anesthesia Quick Evaluation

## 2014-03-01 NOTE — Op Note (Signed)
03/01/2014  4:09 PM  PATIENT:  Rushie Nyhan  43 y.o. female  PRE-OPERATIVE DIAGNOSIS:  Dysfunctional Uterine Bleeding, Fibroid, LOST IUD  POST-OPERATIVE DIAGNOSIS:  Dysfunctional Uterine Bleeding, Fibroid  PROCEDURE:  Procedure(s): IUD REMOVAL ROBOTIC ASSISTED TOTAL HYSTERECTOMY BILATERAL SALPINGECTOMY MYOMECTOMY LYSIS OF ADHESIONS MCCALL CUL DE PLASTY  SURGEON:  Surgeon(s): Lovenia Kim, MD  ASSISTANTS: Janann Colonel, CNM   ANESTHESIA:   local and general  ESTIMATED BLOOD LOSS: 150CC  DRAINS: Urinary Catheter (Foley)   LOCAL MEDICATIONS USED:  ROPIVICAINE and Amount: 60 ml  SPECIMEN:  Source of Specimen:  UTERUS, CERVIX AND BILATERAL TUBES  DISPOSITION OF SPECIMEN:  PATHOLOGY  COUNTS:  YES  DICTATION #: [id number]  PLAN OF CARE: DC HOME  PATIENT DISPOSITION:  PACU - hemodynamically stable.

## 2014-03-01 NOTE — Progress Notes (Signed)
Patient ID: Jane Avila, female   DOB: 03-Feb-1971, 43 y.o.   MRN: [id number] Patient seen and examined. Consent witnessed and signed. No changes noted. Update completed.\ CBC    Component Value Date/Time   WBC 8.6 02/24/2014 1025   RBC 4.83 02/24/2014 1025   HGB 14.4 02/24/2014 1025   HCT 43.4 02/24/2014 1025   PLT 282 02/24/2014 1025   MCV 89.9 02/24/2014 1025   MCH 29.8 02/24/2014 1025   MCHC 33.2 02/24/2014 1025   RDW 13.8 02/24/2014 1025   LYMPHSABS 1.5 09/28/2011 1055   MONOABS 0.5 09/28/2011 1055   EOSABS 0.1 09/28/2011 1055   BASOSABS 0.0 09/28/2011 1055

## 2014-03-01 NOTE — Transfer of Care (Signed)
Immediate Anesthesia Transfer of Care Note  Patient: Jane Avila  Procedure(s) Performed: Procedure(s): ROBOTIC ASSISTED TOTAL HYSTERECTOMY (N/A) BILATERAL SALPINGECTOMY (Bilateral)  Patient Location: PACU  Anesthesia Type:General  Level of Consciousness: awake, alert  and oriented  Airway & Oxygen Therapy: Patient Spontanous Breathing and Patient connected to nasal cannula oxygen  Post-op Assessment: Report given to PACU RN and Post -op Vital signs reviewed and stable  Post vital signs: Reviewed and stable  Complications: No apparent anesthesia complications

## 2014-03-01 NOTE — OR Nursing (Signed)
Pt position assessed.  Pt positioned appropriately and within normal limits.  OR team notified.

## 2014-03-01 NOTE — Anesthesia Postprocedure Evaluation (Signed)
  Anesthesia Post-op Note  Anesthesia Post Note  Patient: Jane Avila  Procedure(s) Performed: Procedure(s) (LRB): ROBOTIC ASSISTED TOTAL HYSTERECTOMY (N/A) BILATERAL SALPINGECTOMY (Bilateral)  Anesthesia type: General  Patient location: PACU  Post pain: Pain level controlled  Post assessment: Post-op Vital signs reviewed  Last Vitals:  Filed Vitals:   03/01/14 1700  BP: 109/56  Pulse:   Temp:   Resp:     Post vital signs: Reviewed  Level of consciousness: sedated  Complications: No apparent anesthesia complications

## 2014-03-02 ENCOUNTER — Encounter (HOSPITAL_COMMUNITY): Payer: Self-pay | Admitting: Obstetrics and Gynecology

## 2014-03-02 DIAGNOSIS — D259 Leiomyoma of uterus, unspecified: Secondary | ICD-10-CM | POA: Diagnosis not present

## 2014-03-02 LAB — BASIC METABOLIC PANEL
Anion gap: 10 (ref 5–15)
BUN: 7 mg/dL (ref 6–23)
CO2: 26 mEq/L (ref 19–32)
Calcium: 8.3 mg/dL — ABNORMAL LOW (ref 8.4–10.5)
Chloride: 103 mEq/L (ref 96–112)
Creatinine, Ser: 0.61 mg/dL (ref 0.50–1.10)
GFR calc Af Amer: >90 mL/min (ref >90)
GFR calc non Af Amer: >90 mL/min (ref >90)
Glucose, Bld: 140 mg/dL — ABNORMAL HIGH (ref 70–99)
Potassium: 4 mEq/L (ref 3.7–5.3)
Sodium: 139 mEq/L (ref 137–147)

## 2014-03-02 LAB — CBC
HCT: 36.8 % (ref 36.0–46.0)
Hemoglobin: 12.1 g/dL (ref 12.0–15.0)
MCH: 29.4 pg (ref 26.0–34.0)
MCHC: 32.9 g/dL (ref 30.0–36.0)
MCV: 89.5 fL (ref 78.0–100.0)
Platelets: 239 10*3/uL (ref 150–400)
RBC: 4.11 MIL/uL (ref 3.87–5.11)
RDW: 14.1 % (ref 11.5–15.5)
WBC: 11.8 10*3/uL — ABNORMAL HIGH (ref 4.0–10.5)

## 2014-03-02 MED ORDER — OXYCODONE-ACETAMINOPHEN 5-325 MG PO TABS: 1.0000 | ORAL_TABLET | ORAL | Status: DC | PRN

## 2014-03-02 MED ORDER — TRAMADOL HCL 50 MG PO TABS: 50.0000 mg | ORAL_TABLET | Freq: Four times a day (QID) | ORAL | Status: DC | PRN

## 2014-03-02 NOTE — Op Note (Signed)
Jane Avila, KILLILEA NO.:  [id number]  MEDICAL RECORD NO.:  [id number]  LOCATION:  5809                          FACILITY:  [facility]  PHYSICIAN:  Lovenia Kim, M.D.DATE OF BIRTH:  03/15/1971  DATE OF PROCEDURE:  03/01/2014 DATE OF DISCHARGE:                              OPERATIVE REPORT   PREOPERATIVE DIAGNOSES: 1. Symptomatic fibroids. 2. Pelvic pain. 3. Dysmenorrhea. 4. Dysmenorrhagia. 5. Lost intra-uterine device.  POSTOPERATIVE DIAGNOSES: 1. Symptomatic fibroids. 2. Pelvic pain. 3. Dysmenorrhea. 4. Dysmenorrhagia. 5. Lost intra-uterine device.  PROCEDURE: 1. Intra-uterine device removal . 2. Robotically assisted Total laparoscopic hysterectomy. 3. Bilateral salpingectomy. 4. Myomectomy. 5. Lysis of adhesions. 6. McCall culdoplasty.  SURGEON:  Lovenia Kim, M.D.  ASSISTANT:  Artelia Laroche, C.N.M.  ANESTHESIA:  Local and general.  ESTIMATED BLOOD LOSS:  150 mL.  COMPLICATIONS:  None.  DRAINS:  Foley.  COUNTS:  Correct.  DISPOSITION:  The patient to recovery in good condition.  FINDINGS:  600 g uterus, bilateral normal ovaries, bilateral normal tubes, sigmoid adhesions obscuring the left adnexa, and enterocele.  BRIEF OPERATIVE NOTE:  After being apprised of risks of anesthesia, infection, bleeding, injury to surrounding organs, possible need for repair, delayed versus immediate complications to include bowel and bladder injury, possible need for repair, the patient was brought to the operating room where she was administered a general anesthetic without complications.  Prepped and draped in usual sterile fashion.  Foley catheter placed.  RUMI retractor placed per vagina without difficulty. At this time, a supraumbilical incision was made with a scalpel.  Veress needle placed to opening pressure of -2, 4 L CO2 insufflated without difficulty.  Trocar was placed atraumatically.  Pictures taken.  Normal liver, gallbladder bed,  normal appendiceal area, normal splenic flexure, and diaphragm.  At this time, 2 robotic ports were placed on the right and 2 on the left after establishing a deep Trendelenburg position and noting a very large anterior fibroid with a clear anterior and posterior cul-de-sac.  The robotic ports were docked in a standard fashion. Placement of Endo-Shears, PK forceps, and ProGrasp.  At this time, the ureters were identified on the left.  The retroperitoneal space was entered and the sigmoid adhesions were sharply lysed to further delineate the left adnexa.  After clearing the sigmoid adhesions from the left adnexa, the mesosalpinx on the left was cauterized and divided. The retroperitoneal space was entered distal to the round ligament and medial to the infundibulopelvic ligament skeletonizing the pedicles for the tubo-ovarian ligament and pushing the ureter caudad in this fashion. The round ligament was divided on the left, bladder flap was developed sharply down to the level of the uterine arteries, and uterine arteries were seen peristalsing on the left side.  These were cauterized at the level of the cervicovaginal junction, but not divided.  At this time, attention was turned to the right whereby the mesosalpinx was also divided in a standard fashion.  Retroperitoneal space was entered. Ureters identified.  The tubo-ovarian ligament was skeletonized and divided.  The posterior leaf of the broad ligament was dissected down to the cervicovaginal junction and the round ligament was opened.  The bladder flap was sharply developed along its entirety  and RUMI cup was bulging through the anterior cup at this time.  Uterine vessels on the right are cauterized and divided and on the left side, they are then divided.  The specimen was then detached and floated into the pelvis where myomectomy was performed dissecting out and bisecting the large anterior myoma, specimen was then able to be long  gated and removed vaginally without morcellation.  The cuff was closed using a 0 V-Loc suture in a continuous running fashion.  A second imbricating layer was placed and McCall culdoplasty suture was done in a standard fashion. Good hemostasis was noted.  Irrigation was accomplished.  Both ureters were seen peristalsing normally and bilaterally.  The bladder flap was inspected and found to be hemostatic.  At this time, CO2 was released. The robot was undocked and all trocars were removed under direct visualization.  Good hemostasis has been noted.  Positive pressure was applied.  Incisions were closed using 0 Vicryl, 4-0 Vicryl, and Dermabond.  Urine was clear and copious.  Vaginal exam reveals a well- approximated and well-supported vaginal apex.  The patient tolerated the procedure well, was awakened, and transferred to recovery in good condition.     Lovenia Kim, M.D.     RJT/MEDQ  D:  03/01/2014  T:  03/02/2014  Job:  [id number]

## 2014-03-02 NOTE — Addendum Note (Signed)
Addendum  created 03/02/14 1015 by Lenox Ponds, CRNA   Modules edited: Notes Section   Notes Section:  File: [id number]

## 2014-03-02 NOTE — Progress Notes (Signed)
Pt discharged home with husband... Discharge instructions reviewed with pt and she verbalized understanding... Condition stable... No equipment... Ambulated to car with Esmeralda Arthur, RN.

## 2014-03-02 NOTE — Anesthesia Postprocedure Evaluation (Signed)
  Anesthesia Post-op Note  Patient: Jane Avila  Procedure(s) Performed: Procedure(s): ROBOTIC ASSISTED TOTAL HYSTERECTOMY (N/A) BILATERAL SALPINGECTOMY (Bilateral)  Patient Location: [facility]  Anesthesia Type:General  Level of Consciousness: awake, alert  and oriented  Airway and Oxygen Therapy: Patient Spontanous Breathing  Post-op Pain: mild  Post-op Assessment: Post-op Vital signs reviewed, Patient's Cardiovascular Status Stable, Respiratory Function Stable, Patent Airway, No signs of Nausea or vomiting and Pain level controlled  Post-op Vital Signs: Reviewed and stable  Last Vitals:  Filed Vitals:   03/02/14 0925  BP:   Pulse:   Temp:   Resp: 18    Complications: No apparent anesthesia complications

## 2014-03-02 NOTE — Progress Notes (Signed)
1 Day Post-Op Procedure(s) (LRB): ROBOTIC ASSISTED TOTAL HYSTERECTOMY (N/A) BILATERAL SALPINGECTOMY (Bilateral)  Subjective: Patient reports nausea, incisional pain, tolerating PO, + flatus and no problems voiding.    Objective: Patient Vitals for the past 24 hrs:  BP Temp Temp src Pulse Resp SpO2 Height Weight  03/02/14 0604 105/64 mmHg 97.4 F (36.3 C) Oral 79 20 98 % - -  03/02/14 0601 - - - - (!) 24 97 % - -  03/02/14 0157 - - - - (!) 23 98 % - -  03/02/14 0154 121/70 mmHg 99.8 F (37.7 C) Oral 79 17 99 % - -  03/01/14 2322 - - - - (!) 24 98 % - -  03/01/14 2134 123/87 mmHg 98.7 F (37.1 C) Oral 97 (!) 23 98 % - -  03/01/14 2023 - - - - - 98 % - -  03/01/14 1830 132/73 mmHg 99.2 F (37.3 C) Oral 75 16 99 % - -  03/01/14 1754 - - - - 16 97 % - -  03/01/14 1736 - - - - 16 97 % - -  03/01/14 1735 - - - - - - 5' 4"  (1.626 m) 79.379 kg (175 lb)  03/01/14 1730 129/65 mmHg 98.6 F (37 C) Oral 67 16 97 % - -  03/01/14 1715 110/64 mmHg 98.7 F (37.1 C) - 64 (!) 44 97 % - -  03/01/14 1700 (!) 109/56 mmHg - - 63 (!) 35 98 % - -  03/01/14 1645 (!) 102/58 mmHg - - 61 (!) 44 98 % - -  03/01/14 1630 108/61 mmHg - - 69 (!) 25 98 % - -  03/01/14 1615 (!) 111/59 mmHg 98.7 F (37.1 C) - - 20 98 % - -  03/01/14 1128 - 98.2 F (36.8 C) - 93 20 99 % - -   CBC    Component Value Date/Time   WBC 11.8* 03/02/2014 0535   RBC 4.11 03/02/2014 0535   HGB 12.1 03/02/2014 0535   HCT 36.8 03/02/2014 0535   PLT 239 03/02/2014 0535   MCV 89.5 03/02/2014 0535   MCH 29.4 03/02/2014 0535   MCHC 32.9 03/02/2014 0535   RDW 14.1 03/02/2014 0535   LYMPHSABS 1.5 09/28/2011 1055   MONOABS 0.5 09/28/2011 1055   EOSABS 0.1 09/28/2011 1055   BASOSABS 0.0 09/28/2011 1055    I have reviewed patient's vital signs, intake and output, medications and labs.  General: alert, cooperative and appears stated age Resp: clear to auscultation bilaterally and normal percussion bilaterally Cardio: regular rate and  rhythm, S1, S2 normal, no murmur, click, rub or gallop and normal apical impulse GI: soft, non-tender; bowel sounds normal; no masses,  no organomegaly, normal findings: aorta normal and bowel sounds normal and incision: clean, dry and intact Extremities: extremities normal, atraumatic, no cyanosis or edema and Homans sign is negative, no sign of DVT Vaginal Bleeding: minimal  Assessment: s/p Procedure(s): ROBOTIC ASSISTED TOTAL HYSTERECTOMY (N/A) BILATERAL SALPINGECTOMY (Bilateral): stable, progressing well and tolerating diet  Plan: Advance diet Encourage ambulation Advance to PO medication Discontinue IV fluids Discharge home  LOS: 1 day    Raetta Agostinelli J 03/02/2014, 7:29 AM

## 2014-03-04 ENCOUNTER — Inpatient Hospital Stay (HOSPITAL_COMMUNITY): Payer: BC Managed Care – PPO

## 2014-03-04 ENCOUNTER — Inpatient Hospital Stay (HOSPITAL_COMMUNITY)
Admission: AD | Admit: 2014-03-04 | Discharge: 2014-03-04 | Disposition: A | Discharge status: Home / Self Care | Payer: BC Managed Care – PPO | Source: Ambulatory Visit | Attending: Obstetrics and Gynecology | Admitting: Obstetrics and Gynecology

## 2014-03-04 ENCOUNTER — Encounter (HOSPITAL_COMMUNITY): Payer: Self-pay | Admitting: Radiology

## 2014-03-04 DIAGNOSIS — R0602 Shortness of breath: Secondary | ICD-10-CM

## 2014-03-04 DIAGNOSIS — Z90710 Acquired absence of both cervix and uterus: Secondary | ICD-10-CM | POA: Diagnosis not present

## 2014-03-04 DIAGNOSIS — J069 Acute upper respiratory infection, unspecified: Secondary | ICD-10-CM | POA: Insufficient documentation

## 2014-03-04 DIAGNOSIS — E860 Dehydration: Secondary | ICD-10-CM | POA: Diagnosis not present

## 2014-03-04 DIAGNOSIS — Z87891 Personal history of nicotine dependence: Secondary | ICD-10-CM | POA: Insufficient documentation

## 2014-03-04 DIAGNOSIS — R0989 Other specified symptoms and signs involving the circulatory and respiratory systems: Secondary | ICD-10-CM | POA: Diagnosis present

## 2014-03-04 HISTORY — DX: Benign neoplasm of connective and other soft tissue, unspecified: D21.9

## 2014-03-04 LAB — URINE MICROSCOPIC-ADD ON

## 2014-03-04 LAB — URINALYSIS, ROUTINE W REFLEX MICROSCOPIC
Bilirubin Urine: NEGATIVE
Glucose, UA: NEGATIVE mg/dL
Ketones, ur: 15 mg/dL — AB
Leukocytes, UA: NEGATIVE
Nitrite: NEGATIVE
Protein, ur: 30 mg/dL — AB
Specific Gravity, Urine: 1.02 (ref 1.005–1.030)
Urobilinogen, UA: 0.2 mg/dL (ref 0.0–1.0)
pH: 6.5 (ref 5.0–8.0)

## 2014-03-04 LAB — D-DIMER, QUANTITATIVE: D-Dimer, Quant: 3.28 ug/mL-FEU — ABNORMAL HIGH (ref 0.00–0.48)

## 2014-03-04 MED ORDER — SODIUM CHLORIDE 0.45 % IV SOLN
INTRAVENOUS | Status: DC
  Administered 2014-03-04: 18:00:00 via INTRAVENOUS

## 2014-03-04 MED ORDER — IOHEXOL 350 MG/ML SOLN
100.0000 mL | Freq: Once | INTRAVENOUS | Status: AC | PRN
  Administered 2014-03-04: 100 mL via INTRAVENOUS

## 2014-03-04 MED ORDER — CEFUROXIME AXETIL 500 MG PO TABS: 500.0000 mg | ORAL_TABLET | Freq: Two times a day (BID) | ORAL | Status: DC

## 2014-03-04 MED ORDER — BENZONATATE 200 MG PO CAPS: 200.0000 mg | ORAL_CAPSULE | Freq: Three times a day (TID) | ORAL | Status: DC | PRN

## 2014-03-04 MED ORDER — ACETAMINOPHEN 500 MG PO TABS
1000.0000 mg | ORAL_TABLET | ORAL | Status: AC
  Administered 2014-03-04: 1000 mg via ORAL
  Filled 2014-03-04: qty 2

## 2014-03-04 MED ORDER — CEFTRIAXONE SODIUM IN DEXTROSE 20 MG/ML IV SOLN
1.0000 g | Freq: Once | INTRAVENOUS | Status: AC
  Administered 2014-03-04: 1 g via INTRAVENOUS
  Filled 2014-03-04: qty 50

## 2014-03-04 MED ORDER — ACETAMINOPHEN 500 MG PO TABS: 1000.0000 mg | ORAL_TABLET | ORAL | Status: DC

## 2014-03-04 NOTE — Discharge Instructions (Signed)
Shortness of Breath Shortness of breath means you have trouble breathing. It could also mean that you have a medical problem.  CAUSES   Not enough oxygen in the air.  Certain lung diseases, infections, or problems - commonly upper respiratory infections or asthma.  Heart disease or conditions, such as angina or heart failure.  Chest or back injuries or stiffness.  Being overweight.  Anxiety, which can make you feel like you are not getting enough air.  Clot in your lung. DIAGNOSIS  Serious medical problems can often be found during your physical exam. Tests may also be done to determine why you are having shortness of breath. Tests may include:  Chest X-rays.  Lung function tests.  Blood tests.  Imaging scans.   Your health care provider may not be able to find a cause for your shortness of breath after your exam.  In this case, it is important to have a follow-up exam with your health care provider as directed.  SEEK IMMEDIATE CARE IF:   Your shortness of breath gets worse.  You feel light-headed, faint, or develop a cough not controlled with medicines.  You start coughing up blood.  You have pain with breathing.  You have chest pain or pain in your arms, shoulders, or abdomen.  You have a fever.  You are unable to walk up stairs or exercise the way you normally do. MAKE SURE YOU:  Understand these instructions.  Will watch your condition.  Will get help right away if you are not doing well or get worse.   Document Released: 11/28/2000 Document Revised: 03/10/2013 Document Reviewed: 05/21/2011 [facility] Patient Information 2015 [place], [place]. This information is not intended to replace advice given to you by your health care provider. Make sure you discuss any questions you have with your health care provider.  Cough, Adult  A cough is a reflex. It helps you clear your throat and airways. A cough can help heal your body. A cough can last 2 or 3 weeks  (acute) or may last more than 8 weeks (chronic). Some common causes of a cough can include an infection, allergy, or a cold. HOME CARE  Only take medicine as told by your doctor.  If given, take your medicines (antibiotics) as told. Finish them even if you start to feel better.  Use a cold steam vaporizer or humidifier in your home. This can help loosen thick spit (secretions).  Sleep so you are almost sitting up (semi-upright). Use pillows to do this. This helps reduce coughing.  Rest as needed. GET HELP RIGHT AWAY IF:  You have yellowish-white fluid (pus) in your thick spit.  Your cough gets worse.  Your medicine does not reduce coughing, and you are losing sleep.  You cough up blood.  You have trouble breathing.  Your pain gets worse and medicine does not help.  You have a fever over 101. MAKE SURE YOU:   Understand these instructions.  Will watch your condition.  Will get help right away if you are not doing well or get worse. Document Released: 11/16/2010 Document Revised: 07/20/2013 Document Reviewed: 11/16/2010 [facility] Patient Information 2015 [place], [place]. This information is not intended to replace advice given to you by your health care provider. Make sure you discuss any questions you have with your health care provider.

## 2014-03-04 NOTE — MAU Note (Signed)
Pt had Hyterectomy on MOnday. Developed fever and SOB . Temp 103 sent from office for further eval.

## 2014-03-04 NOTE — MAU Provider Note (Signed)
History     CSN: [id number]  Arrival date and time: 03/04/14 1524 Call from MD to place orders @ 1545 Call from nurse - tests completed @ 1655 Call to MD - orders received for IV ABX and oral ABX for DC    Chief Complaint  Patient presents with  . Fever  . Post-op Problem   HPI  Sudden onset cough and congestion Fever 100-100.6 in past 24 hours General malaise with body aches  Past Medical History  Diagnosis Date  . Migraine   . Thyroid disease   . Fibroid     Past Surgical History  Procedure Laterality Date  . Dilation and curettage of uterus    . Knee arthroscopy      1991  . Robotic assisted total hysterectomy N/A 03/01/2014    Procedure: ROBOTIC ASSISTED TOTAL HYSTERECTOMY;  Surgeon: Lovenia Kim, MD;  Location: [facility];  Service: Gynecology;  Laterality: N/A;  . Bilateral salpingectomy Bilateral 03/01/2014    Procedure: BILATERAL SALPINGECTOMY;  Surgeon: Lovenia Kim, MD;  Location: [facility];  Service: Gynecology;  Laterality: Bilateral;  . Tonsillectomy      History reviewed. No pertinent family history.  History  Substance Use Topics  . Smoking status: Former Smoker -- 4 years    Types: Cigarettes    Quit date: 09/22/1998  . Smokeless tobacco: Never Used  . Alcohol Use: No    Allergies:  Allergies  Allergen Reactions  . Erythromycin Anaphylaxis  . Tetracyclines & Related Anaphylaxis  . Sulfa Antibiotics Rash    Prescriptions prior to admission  Medication Sig Dispense Refill Last Dose  . levothyroxine (SYNTHROID, LEVOTHROID) 88 MCG tablet Take 88 mcg by mouth daily before breakfast.   03/03/2014 at Unknown time  . loratadine (CLARITIN) 10 MG tablet Take 10 mg by mouth daily as needed for allergies.   Past Week at Unknown time  . Multiple Vitamin (MULTIVITAMIN WITH MINERALS) TABS tablet Take 1 tablet by mouth daily.   Past Week at Unknown time  . oxyCODONE-acetaminophen (PERCOCET/ROXICET) 5-325 MG per tablet Take 1-2 tablets by mouth every  3 (three) hours as needed for moderate pain. 40 tablet 0 03/03/2014 at Unknown time  . traMADol (ULTRAM) 50 MG tablet Take 1-2 tablets (50-100 mg total) by mouth every 6 (six) hours as needed for moderate pain. 30 tablet 0 03/04/2014 at Unknown time  . amphetamine-dextroamphetamine (ADDERALL) 15 MG tablet Take 1 tablet (15 mg total) by mouth 2 (two) times daily. (Patient taking differently: Take 15 mg by mouth 2 (two) times daily as needed (for studying). ) 60 tablet 0 02/23/2014  . clonazePAM (KLONOPIN) 0.5 MG tablet Take 1 tablet (0.5 mg total) by mouth at bedtime as needed for anxiety. (Patient not taking: Reported on 02/24/2014) 30 tablet 1   . PROTOPIC 0.1 % ointment Apply 1 application topically 2 (two) times daily as needed (dermatitis).    03/01/2014    ROS  Malaise Sinus congestion Cough Mild sore throat Fever - no chills No nausea or vomiting + BM in past 24 hours  Physical Exam   Blood pressure 140/89, pulse 92, temperature 100.2 F (37.9 C), temperature source Oral, resp. rate 18, height 5' 3"  (1.6 m), weight 79.47 kg (175 lb 3.2 oz), SpO2 96 %.  Physical Exam   Seen in office with full exam by Dr Ronita Hipps  Additional exam Alert and oriented / NAD / appears ill Lungs clear - O2 sat 94-97% room air Abdomen soft and non-tender  CBC  in office with WBC 8.0 / HGB 15  EXAM:CHEST 2 VIEW  COMPARISON: None.  FINDINGS: Lungs are adequately inflated with consolidation over the posterior medial right lower lobe. Possible small amount of posterior pleural fluid. Cardiomediastinal silhouette is within normal. Bones soft tissues are unremarkable.  IMPRESSION: Consolidation over the posterior medial right lower lobe likely a pneumonia.  CLINICAL DATA: 43 year old female with 2 day history of shortness of breath, cough and fever. She is status post recent hysterectomy performed on 03/01/2014. Evaluate for pulmonary embolus  EXAM: CT ANGIOGRAPHY CHEST WITH  CONTRAST  TECHNIQUE: Multidetector CT imaging of the chest was performed using the standard protocol during bolus administration of intravenous contrast. Multiplanar CT image reconstructions and MIPs were obtained to evaluate the vascular anatomy.  CONTRAST: 117m OMNIPAQUE IOHEXOL 350 MG/ML SOLN  COMPARISON: Chest x-ray obtained earlier today  FINDINGS: Mediastinum: Unremarkable CT appearance of the thyroid gland. No suspicious mediastinal or hilar adenopathy. No soft tissue mediastinal mass. The thoracic esophagus is unremarkable.  Heart/Vascular: Adequate opacification of the pulmonary arteries to the proximal subsegmental level. No evidence of central filling defect to suggest acute pulmonary embolus. Main and central pulmonary arteries are within normal limits for size. The right common carotid and stress that the right brachiocephalic and left common carotid artery share a common origin. Normal caliber aorta without evidence of dissection. The heart remains within normal limits for size. No pericardial effusion.  Lungs/Pleura: Trace bilateral layering pleural effusions. Volume loss and consolidation in the right greater than left lower lobes. This is favored to reflect atelectasis. Otherwise, the lungs are clear. No pulmonary edema.  Bones/Soft Tissues: No acute fracture or aggressive appearing lytic or blastic osseous lesion. Scattered subcutaneous emphysema presumably related to the recent cesarean section.  Upper Abdomen: Visualized upper abdominal organs are unremarkable.  Review of the MIP images confirms the above findings.  IMPRESSION: 1. Negative for acute pulmonary embolus. 2. Right greater than left lower lobe volume loss and consolidation favored to reflect atelectasis. A     small amount of superimposed infectious or inflammatory infiltrate (pneumonia versus aspiration)     is difficult to exclude entirely, particularly in the right  lowerlobe. 3. Trace bilateral layering pleural effusions. 4. Subcutaneous emphysema    Results for OKEYLIN, FERRYMAN(MRN [id number] as of 03/04/2014 17:47  Ref. Range 03/04/2014 15:52  Color, Urine Latest Range: YELLOW  YELLOW  APPearance Latest Range: CLEAR  HAZY (A)  Specific Gravity, Urine Latest Range: 1.005-1.030  1.020  pH Latest Range: 5.0-8.0  6.5  Glucose Latest Range: NEGATIVE mg/dL NEGATIVE  Bilirubin Urine Latest Range: NEGATIVE  NEGATIVE  Ketones, ur Latest Range: NEGATIVE mg/dL 15 (A)  Protein Latest Range: NEGATIVE mg/dL 30 (A)  Urobilinogen, UA Latest Range: 0.0-1.0 mg/dL 0.2  Nitrite Latest Range: NEGATIVE  NEGATIVE  Leukocytes, UA Latest Range: NEGATIVE  NEGATIVE  Hgb urine dipstick Latest Range: NEGATIVE  LARGE (A)  Urine-Other No range found MUCOUS PRESENT  WBC, UA Latest Range: <3 WBC/hpf 0-2  RBC / HPF Latest Range: <3 RBC/hpf 0-2  Squamous Epithelial / LPF Latest Range: RARE  FEW (A)    D-Dimer 3.6  MAU Course  Procedures   Assessment and Plan  TC to Dr TRonita Hippswith all test results / VS with temp 100.2 Orders for IVF and IV rocephin 1gm IV now - send home with RX Ceftin x 7 days BID OV with Dr TRonita Hippson Monday RX Tessalon Perles / continue motrin and Tylenol alternating for fever / body aches  URI - possible acute viral illness versus atelectasis with developing pneumonia No evidence of PE Mild dehydration  1) IV x 200-478m with IV ABX rocephin 1gm 2) Tylenol 10071mpo now 3) DC home    BAArtelia Laroche2/17/2015, 5:43 PM

## 2014-03-04 NOTE — MAU Note (Signed)
Abdominal dressing removed per pt request, was to remove dressing today @ home.  Removed without difficulty, mid abd incision intact, bruising noted, sutures intact.

## 2014-06-15 ENCOUNTER — Encounter: Payer: Self-pay | Admitting: Internal Medicine

## 2014-06-15 ENCOUNTER — Ambulatory Visit (INDEPENDENT_AMBULATORY_CARE_PROVIDER_SITE_OTHER): Payer: BLUE CROSS/BLUE SHIELD | Admitting: Internal Medicine

## 2014-06-15 VITALS — BP 112/84 | HR 78 | Temp 98.0°F | Wt 167.0 lb

## 2014-06-15 DIAGNOSIS — F909 Attention-deficit hyperactivity disorder, unspecified type: Secondary | ICD-10-CM

## 2014-06-15 DIAGNOSIS — F988 Other specified behavioral and emotional disorders with onset usually occurring in childhood and adolescence: Secondary | ICD-10-CM

## 2014-06-15 NOTE — Patient Instructions (Signed)
Take Adderall 15 mg twice daily as needed for studying for exams. Return for physical exam and the near future.

## 2014-06-15 NOTE — Progress Notes (Signed)
   Subjective:    Patient ID: Jane Avila, female    DOB: 03/08/1971, 44 y.o.   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 She is looking for jobs. Has several job offers. Has to take exams in the near future. She wants medication for attention deficit. Has previously been on medication for attention deficit for studying in exams. Previously took Adderall 15 mg twice daily with success. Sometimes does not need quite that much medication while studying. Sometimes would just take a half to a whole tablet once a day. Short acting Adderall worked well for her.  Not seen here since 2014. Has GYN physician.    Review of Systems     Objective:   Physical Exam Spent 15 minutes speaking with patient about these issues       Assessment & Plan:  Attention deficit disorder  Plan: Prescription for Adderall 15 mg ( #60) 1 by mouth twice a day when necessary studying for exams. She has appointment for physical exam in the near future.

## 2014-07-06 ENCOUNTER — Other Ambulatory Visit: Payer: BLUE CROSS/BLUE SHIELD | Admitting: Internal Medicine

## 2014-07-06 DIAGNOSIS — Z130 Encounter for screening for diseases of the blood and blood-forming organs and certain disorders involving the immune mechanism: Secondary | ICD-10-CM

## 2014-07-06 DIAGNOSIS — Z13220 Encounter for screening for lipoid disorders: Secondary | ICD-10-CM

## 2014-07-06 DIAGNOSIS — Z1329 Encounter for screening for other suspected endocrine disorder: Secondary | ICD-10-CM

## 2014-07-06 DIAGNOSIS — Z1321 Encounter for screening for nutritional disorder: Secondary | ICD-10-CM

## 2014-07-06 DIAGNOSIS — Z0000 Encounter for general adult medical examination without abnormal findings: Secondary | ICD-10-CM

## 2014-07-06 LAB — LIPID PANEL
Cholesterol: 163 mg/dL (ref 0–200)
HDL: 66 mg/dL (ref >=46)
LDL Cholesterol: 86 mg/dL (ref 0–99)
Total CHOL/HDL Ratio: 2.5 Ratio
Triglycerides: 57 mg/dL (ref <150)
VLDL: 11 mg/dL (ref 0–40)

## 2014-07-06 LAB — COMPLETE METABOLIC PANEL WITH GFR
ALT: 14 U/L (ref 0–35)
AST: 16 U/L (ref 0–37)
Albumin: 3.8 g/dL (ref 3.5–5.2)
Alkaline Phosphatase: 62 U/L (ref 39–117)
BUN: 16 mg/dL (ref 6–23)
CO2: 25 mEq/L (ref 19–32)
Calcium: 8.8 mg/dL (ref 8.4–10.5)
Chloride: 105 mEq/L (ref 96–112)
Creat: 0.69 mg/dL (ref 0.50–1.10)
GFR, Est African American: >89 mL/min
GFR, Est Non African American: >89 mL/min
Glucose, Bld: 79 mg/dL (ref 70–99)
Potassium: 4.7 mEq/L (ref 3.5–5.3)
Sodium: 139 mEq/L (ref 135–145)
Total Bilirubin: 0.4 mg/dL (ref 0.2–1.2)
Total Protein: 6.3 g/dL (ref 6.0–8.3)

## 2014-07-06 LAB — CBC WITH DIFFERENTIAL/PLATELET
Basophils Absolute: 0 10*3/uL (ref 0.0–0.1)
Basophils Relative: 0 % (ref 0–1)
Eosinophils Absolute: 0.1 10*3/uL (ref 0.0–0.7)
Eosinophils Relative: 2 % (ref 0–5)
HCT: 43.8 % (ref 36.0–46.0)
Hemoglobin: 14.6 g/dL (ref 12.0–15.0)
Lymphocytes Relative: 27 % (ref 12–46)
Lymphs Abs: 1.8 10*3/uL (ref 0.7–4.0)
MCH: 29.8 pg (ref 26.0–34.0)
MCHC: 33.3 g/dL (ref 30.0–36.0)
MCV: 89.4 fL (ref 78.0–100.0)
MPV: 9.1 fL (ref 8.6–12.4)
Monocytes Absolute: 0.5 10*3/uL (ref 0.1–1.0)
Monocytes Relative: 8 % (ref 3–12)
Neutro Abs: 4.3 10*3/uL (ref 1.7–7.7)
Neutrophils Relative %: 63 % (ref 43–77)
Platelets: 276 10*3/uL (ref 150–400)
RBC: 4.9 MIL/uL (ref 3.87–5.11)
RDW: 13.5 % (ref 11.5–15.5)
WBC: 6.8 10*3/uL (ref 4.0–10.5)

## 2014-07-06 LAB — TSH: TSH: 2.217 u[IU]/mL (ref 0.350–4.500)

## 2014-07-07 LAB — VITAMIN D 25 HYDROXY (VIT D DEFICIENCY, FRACTURES): Vit D, 25-Hydroxy: 23 ng/mL — ABNORMAL LOW (ref 30–100)

## 2014-07-09 ENCOUNTER — Ambulatory Visit (INDEPENDENT_AMBULATORY_CARE_PROVIDER_SITE_OTHER): Payer: BLUE CROSS/BLUE SHIELD | Admitting: Internal Medicine

## 2014-07-09 ENCOUNTER — Encounter: Payer: Self-pay | Admitting: Internal Medicine

## 2014-07-09 VITALS — BP 118/78 | HR 88 | Temp 98.0°F | Ht 63.0 in | Wt 157.0 lb

## 2014-07-09 DIAGNOSIS — E559 Vitamin D deficiency, unspecified: Secondary | ICD-10-CM | POA: Diagnosis not present

## 2014-07-09 DIAGNOSIS — Z0000 Encounter for general adult medical examination without abnormal findings: Secondary | ICD-10-CM | POA: Diagnosis not present

## 2014-07-09 DIAGNOSIS — O905 Postpartum thyroiditis: Secondary | ICD-10-CM | POA: Diagnosis not present

## 2014-07-09 DIAGNOSIS — Z01818 Encounter for other preprocedural examination: Secondary | ICD-10-CM

## 2014-07-09 DIAGNOSIS — Z8669 Personal history of other diseases of the nervous system and sense organs: Secondary | ICD-10-CM | POA: Diagnosis not present

## 2014-07-09 LAB — POCT URINALYSIS DIPSTICK
Bilirubin, UA: NEGATIVE
Blood, UA: NEGATIVE
Glucose, UA: NEGATIVE
Ketones, UA: NEGATIVE
Leukocytes, UA: NEGATIVE
Nitrite, UA: NEGATIVE
Protein, UA: NEGATIVE
Spec Grav, UA: 1.01
Urobilinogen, UA: NEGATIVE
pH, UA: 6

## 2014-07-09 NOTE — Patient Instructions (Signed)
Take 2000 units vitamin D 3 daily. Return in 1-2 years or as needed. Continue to see GYN physician annually.

## 2014-07-09 NOTE — Progress Notes (Signed)
   Subjective:    Patient ID: Jane Avila, female    DOB: 17-Feb-1971, 44 y.o.   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 History of hypothyroidism followed by Dr. Elyse Hsu. Diagnosed with postpartum thyroiditis in 2005. Anti-TPO antibody strongly positive. Subsequently TSH went up to 28.171 by September 2005. Was started on Synthroid then. Second pregnancy was lost at 20 weeks in March 2007. Third pregnancy apparently had normal thyroid levels in 2008.  Knee surgery in 1989 and 1990. In December 2015, she had robotic-assisted TAH/BSO by Dr. Ronita Hipps. Subsequently developed postop pneumonia in right lower lobe. History of migraine headaches.  Social history: Married. Nonsmoker. Social alcohol consumption. 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  History of mild attention deficit disorder. Has been given Adderall to help with studying for exams.  Family history: Father with history of hypertension, diabetes, skin cancer. Mother with history of hypertension.    Review of Systems  Constitutional: Negative.   All other systems reviewed and are negative.      Objective:   Physical Exam  Constitutional: She is oriented to person, place, and time. She appears well-developed and well-nourished. No distress.  HENT:  Head: Normocephalic and atraumatic.  Right Ear: External ear normal.  Left Ear: External ear normal.  Mouth/Throat: Oropharynx is clear and moist.  Eyes: Conjunctivae are normal. Pupils are equal, round, and reactive to light. Right eye exhibits no discharge. Left eye exhibits no discharge. No scleral icterus.  Neck: Neck supple. No JVD present. No thyromegaly present.    Cardiovascular: Normal rate, regular rhythm, normal heart sounds and intact distal pulses.   No murmur heard. Pulmonary/Chest: Effort normal and breath sounds normal. No respiratory distress. She has no wheezes. She has no rales. She exhibits no tenderness.  Breasts normal female  Abdominal: Soft. Bowel sounds are normal. She exhibits no distension and no mass. There is no tenderness. There is no rebound and no guarding.  Genitourinary:  Deferred to GYN  Musculoskeletal: Normal range of motion. She exhibits no edema.  Lymphadenopathy:    She has no cervical adenopathy.  Neurological: She is alert and oriented to person, place, and time. She has normal reflexes. No cranial nerve deficit. Coordination normal.  Skin: Skin is warm and dry. No rash noted. She is not diaphoretic.  Psychiatric: She has a normal mood and affect. Her behavior is normal. Judgment and thought content normal.  Vitals reviewed.         Assessment & Plan:  Normal health maintenance exam  History of thyroiditis postpartum 2005 with resultant hypothyroidism  Attention deficit disorder  Vitamin D deficiency.  Plan: Return in 1-2 years or as needed. Continue to see GYN. Lab work is entirely within normal limits with the exception of vitamin D level of 23. Recommended 2000 units vitamin D 3 daily. Gets mammograms through GYN physician

## 2014-07-12 NOTE — Addendum Note (Signed)
Addended by: Leota Jacobsen on: 07/12/2014 02:11 PM   Modules accepted: Orders

## 2015-09-19 ENCOUNTER — Encounter: Payer: BLUE CROSS/BLUE SHIELD | Admitting: Internal Medicine

## 2015-09-27 ENCOUNTER — Ambulatory Visit (INDEPENDENT_AMBULATORY_CARE_PROVIDER_SITE_OTHER): Payer: BLUE CROSS/BLUE SHIELD | Admitting: Internal Medicine

## 2015-09-27 ENCOUNTER — Encounter: Payer: Self-pay | Admitting: Internal Medicine

## 2015-09-27 VITALS — BP 140/94 | HR 74 | Temp 98.5°F | Resp 18 | Ht 63.0 in | Wt 190.0 lb

## 2015-09-27 DIAGNOSIS — N6019 Diffuse cystic mastopathy of unspecified breast: Secondary | ICD-10-CM

## 2015-09-27 DIAGNOSIS — Z8639 Personal history of other endocrine, nutritional and metabolic disease: Secondary | ICD-10-CM

## 2015-09-27 DIAGNOSIS — R635 Abnormal weight gain: Secondary | ICD-10-CM

## 2015-09-27 DIAGNOSIS — Z8669 Personal history of other diseases of the nervous system and sense organs: Secondary | ICD-10-CM

## 2015-09-27 DIAGNOSIS — I10 Essential (primary) hypertension: Secondary | ICD-10-CM | POA: Diagnosis not present

## 2015-09-27 DIAGNOSIS — E039 Hypothyroidism, unspecified: Secondary | ICD-10-CM | POA: Diagnosis not present

## 2015-09-27 DIAGNOSIS — Z0000 Encounter for general adult medical examination without abnormal findings: Secondary | ICD-10-CM

## 2015-09-27 MED ORDER — LOSARTAN POTASSIUM 25 MG PO TABS: 25.0000 mg | ORAL_TABLET | Freq: Every day | ORAL | Status: DC

## 2015-09-27 NOTE — Progress Notes (Signed)
Subjective:    Patient ID: Jane Avila, female    DOB: 07/23/1970, 45 y.o.   MRN: [id number]  HPI 45 year old Female in today for health maintenance exam and evaluation of medical issues. Has noticed her blood pressure is staying elevated particular diastolically. Today blood pressure is 140/94. Has noticed that blood pressures frequently in the 644I and diastolics are ranging in the high 80s and low 90s. Unfortunately her job involves working long hours. 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 She is commuting back and forth. There is little time to exercise. She has gained weight. She has family history of hypertension in both parents.  Patient was diagnosed with postpartum thyroiditis in 2005 with anti-TPO antibody strongly positive. Subsequently TSH went up to 28.171 by September 2005 and she was started on Synthroid.  Her second pregnancy was lost at 20 weeks in March 2007.  Third pregnancy apparently had normal thyroid levels in 2008.  She had knee surgery in 1980 12/05/1988.  In the summer 2015 she had robotic-assisted TAH/BSO by Dr. Claris Che. Subsequently developed postoperative pneumonia in the right lower lobe.  History of migraine headaches.  History of mild attention deficit disorder and has been given Adderall to help with studying for exams.  Social history: Married. Nonsmoker. Social alcohol consumption. 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  Family history: Both parents with hypertension. Father with diabetes and history of skin cancer.        Review of Systems  Constitutional: Negative.   Respiratory: Negative.   Cardiovascular: Negative.   Gastrointestinal: Negative.     Neurological:       History of migraine headaches  All other systems reviewed and are negative.      Objective:   Physical Exam  Constitutional: She appears well-developed and well-nourished. No distress.  HENT:  Head: Normocephalic and atraumatic.  Right Ear: External ear normal.  Left Ear: External ear normal.  Mouth/Throat: No oropharyngeal exudate.  Eyes: Conjunctivae are normal. Pupils are equal, round, and reactive to light.  Neck: Neck supple. No JVD present. No thyromegaly present.  Cardiovascular: Normal rate, regular rhythm, normal heart sounds and intact distal pulses.   No murmur heard. Pulmonary/Chest: Effort normal and breath sounds normal. No respiratory distress. She has no wheezes.  Patient has had recent mammogram. Has thickening and left upper outer quadrant at approximately 1:00 which she says has been there  Abdominal: Soft. Bowel sounds are normal. She exhibits no distension and no mass. There is no tenderness. There is no rebound and no guarding.  Genitourinary:  Deferred to GYN  Musculoskeletal: She exhibits no edema.  Lymphadenopathy:    She has no cervical adenopathy.  Neurological: She is alert. She has normal reflexes. No cranial nerve deficit.  Skin: Skin is warm and dry. No rash noted. She is not diaphoretic.  Psychiatric: She has a normal mood and affect. Her behavior is normal. Judgment and thought content normal.  Vitals reviewed.         Assessment & Plan:  Essential hypertension  Weight gain due to lack of activity  History of thyroiditis now with hypothyroidism treated with thyroid replacement medication  Fibrocystic breast disease-has had recent mammogram which she says was normal  History of migraine headaches  PACs on EKG but no LVH  Lab work reviewed and is within normal limits  Plan: Patient will start losartan 25 mg daily and monitor blood pressure. She'll call me in a couple of weeks and we will decide whether or not we  need to go up to 50 mg daily or not. Once we decide on the dose that is controlling her blood pressure, she will need to have a b- met couple of  weeks afterwards. She has an order that I gave her for a basic metabolic panel which she will hold onto until the appropriate time. She has a hard time getting here to have labs trauma cause of work duties. Therefore we will try to regulate her blood pressure medication by telephone.

## 2015-09-27 NOTE — Patient Instructions (Addendum)
Start Cozaar 25 mg daily and call with blood pressure results in 2 weeks. We will titrate from they are as needed and follow-up with basic metabolic panel.

## 2015-10-07 ENCOUNTER — Telehealth: Payer: Self-pay | Admitting: Internal Medicine

## 2015-10-07 NOTE — Telephone Encounter (Signed)
Have her go ahead and get basic metabolic panel in about 2 weeks and keep a watch on BP. Sounds like dose will be fine.

## 2015-10-07 NOTE — Telephone Encounter (Signed)
Patient was asked to call back with BP readings.  Her readings have been over the past 2 weeks:  111/87 125/85 118/86 112/85  These readings have been taken after taking the medications (but not necessarily within 2 hours or 4-8 hours, she can't specify).

## 2015-10-07 NOTE — Telephone Encounter (Signed)
Spoke with patient and advised of Dr. Verlene Mayer message.  She'll have BMET drawn at [facility] in 2 weeks.  Script was given to patient by Dr. Renold Genta when she was in the office at the time of her visit.  She'll have the labs faxed to the office.  Provided fax # to the patient to add to the script.

## 2015-11-01 ENCOUNTER — Other Ambulatory Visit: Payer: BLUE CROSS/BLUE SHIELD | Admitting: Internal Medicine

## 2015-11-01 ENCOUNTER — Encounter: Payer: Self-pay | Admitting: Internal Medicine

## 2015-11-01 ENCOUNTER — Ambulatory Visit (INDEPENDENT_AMBULATORY_CARE_PROVIDER_SITE_OTHER): Payer: BLUE CROSS/BLUE SHIELD | Admitting: Internal Medicine

## 2015-11-01 VITALS — BP 124/82

## 2015-11-01 DIAGNOSIS — I10 Essential (primary) hypertension: Secondary | ICD-10-CM

## 2015-11-01 DIAGNOSIS — Z8669 Personal history of other diseases of the nervous system and sense organs: Secondary | ICD-10-CM | POA: Diagnosis not present

## 2015-11-01 DIAGNOSIS — E039 Hypothyroidism, unspecified: Secondary | ICD-10-CM

## 2015-11-01 LAB — BASIC METABOLIC PANEL
BUN: 17 mg/dL (ref 7–25)
CO2: 21 mmol/L (ref 20–31)
Calcium: 9 mg/dL (ref 8.6–10.2)
Chloride: 104 mmol/L (ref 98–110)
Creat: 0.76 mg/dL (ref 0.50–1.10)
Glucose, Bld: 163 mg/dL — ABNORMAL HIGH (ref 65–99)
Potassium: 4.2 mmol/L (ref 3.5–5.3)
Sodium: 137 mmol/L (ref 135–146)

## 2015-11-01 MED ORDER — LOSARTAN POTASSIUM 25 MG PO TABS: 25.0000 mg | ORAL_TABLET | Freq: Every day | ORAL | 2 refills | Status: DC

## 2015-11-01 NOTE — Patient Instructions (Signed)
Basic metabolic panel drawn. Continue Cozaar 25 mg daily follow-up in 3-6 months

## 2015-11-01 NOTE — Progress Notes (Signed)
   Subjective:    Patient ID: Jane Avila, female    DOB: 12-29-1970, 45 y.o.   MRN: [id number]  HPI Patient dropped by to have basic metabolic panel drawn on Cozaar 25 mg daily. Says blood pressures have been excellent at home. No complaints or problems.    Review of Systems     Objective:   Physical Exam  See blood pressure above in vital signs      Assessment & Plan:  Essential hypertension  Plan: Basic metabolic panel pending. Continue Cozaar 25 mg daily. Refill to [facility]. Follow-up in 3-6 months. Continue to monitor blood pressure at home.

## 2015-11-18 ENCOUNTER — Other Ambulatory Visit: Payer: Self-pay

## 2015-11-18 MED ORDER — LOSARTAN POTASSIUM 25 MG PO TABS: 25.0000 mg | ORAL_TABLET | Freq: Every day | ORAL | 0 refills | Status: DC

## 2016-02-06 ENCOUNTER — Encounter: Payer: Self-pay | Admitting: Internal Medicine

## 2016-02-06 ENCOUNTER — Ambulatory Visit (INDEPENDENT_AMBULATORY_CARE_PROVIDER_SITE_OTHER): Payer: PRIVATE HEALTH INSURANCE | Admitting: Internal Medicine

## 2016-02-06 VITALS — BP 120/80 | HR 68 | Temp 97.6°F | Ht 63.0 in | Wt 180.0 lb

## 2016-02-06 DIAGNOSIS — F411 Generalized anxiety disorder: Secondary | ICD-10-CM

## 2016-02-06 DIAGNOSIS — I10 Essential (primary) hypertension: Secondary | ICD-10-CM | POA: Diagnosis not present

## 2016-02-06 DIAGNOSIS — G4709 Other insomnia: Secondary | ICD-10-CM

## 2016-02-06 MED ORDER — BUPROPION HCL ER (XL) 150 MG PO TB24: 150.0000 mg | ORAL_TABLET | Freq: Every day | ORAL | 1 refills | Status: DC

## 2016-02-06 MED ORDER — ALPRAZOLAM 0.5 MG PO TABS: 0.5000 mg | ORAL_TABLET | Freq: Every evening | ORAL | 1 refills | Status: DC | PRN

## 2016-02-06 NOTE — Progress Notes (Signed)
   Subjective:    Patient ID: Jane Avila, female    DOB: Aug 09, 1970, 45 y.o.   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 She has a special needs child. At one point her husband lost his job but was able to get some contract work but this is been stressful for them. She finds herself falling asleep but waking up about 4:00 in the morning and can't go back to sleep. She has tried to take antihistamines but has to get up fairly early to go to work and doesn't like to feel hung over.  Says her father was bipolar. She does not feel she has any bipolar tendencies to speak of.  Insomnia and anxiety have been ongoing for some time but worse lately due to situational stress  She is on antihypertensive medication consisting of low-dose Cozaar 25 mg daily. She also has history of hypothyroidism status post thyroiditis and is on thyroid replacement.  History of attention deficit disorder without hyperactivity.    Review of Systems see above     Objective:   Physical Exam  Spent 20 minutes speaking with her about these issues. Some of this is situational stress affecting her sleep patterns.      Assessment & Plan:  Insomnia  Anxiety  Situational stress  Plan: Alprazolam 0.5 mg  1/2-1 tablet by mouth daily at bedtime when necessary sleep. We discussed various options. She knows to take this sparingly as it may cause rebound insomnia she uses it nightly. However, with the situational stress and anxiety she may need something every night 4. At time. She is also on Wellbutrin. She will call with progress report in a couple of weeks.

## 2016-02-06 NOTE — Patient Instructions (Signed)
Continue Wellbutrin. Try Xanax 0.5 mg 1/2-1 tablet at bedtime to see if sleep pattern improves. Call with progress report in a couple of weeks. Continue antihypertensive medication and thyroid replacement medication.

## 2016-03-13 ENCOUNTER — Ambulatory Visit (INDEPENDENT_AMBULATORY_CARE_PROVIDER_SITE_OTHER): Payer: PRIVATE HEALTH INSURANCE | Admitting: Internal Medicine

## 2016-03-13 ENCOUNTER — Encounter: Payer: Self-pay | Admitting: Internal Medicine

## 2016-03-13 VITALS — BP 128/82 | HR 69 | Wt 174.0 lb

## 2016-03-13 DIAGNOSIS — F411 Generalized anxiety disorder: Secondary | ICD-10-CM | POA: Diagnosis not present

## 2016-03-13 DIAGNOSIS — G4709 Other insomnia: Secondary | ICD-10-CM | POA: Diagnosis not present

## 2016-03-13 DIAGNOSIS — G4700 Insomnia, unspecified: Secondary | ICD-10-CM | POA: Insufficient documentation

## 2016-03-13 MED ORDER — CLONAZEPAM 0.5 MG PO TABS: 0.5000 mg | ORAL_TABLET | Freq: Every day | ORAL | 1 refills | Status: DC

## 2016-03-13 MED ORDER — BUPROPION HCL ER (XL) 300 MG PO TB24: 300.0000 mg | ORAL_TABLET | Freq: Every day | ORAL | 1 refills | Status: DC

## 2016-03-13 NOTE — Patient Instructions (Signed)
Klonopin 0.5 mg one half to one tablet at bedtime. Wellbutrin XL increased to 300 mg XL daily. Call with progress report in 4 weeks.

## 2016-03-13 NOTE — Progress Notes (Signed)
   Subjective:    Patient ID: Jane Avila, female    DOB: 06-01-1970, 45 y.o.   MRN: [id number]  HPI   45 year old Female with history of anxiety and insomnia. Has been trying Xanax at night for sleep. If she takes a whole tablet she has a bit of a hung over feeling. If she takes a half tablet she can only sleep 5 hours. Previously was only sleeping 3 hours before starting any medication for sleep at all. She is also on Wellbutrin XL 150 mg daily. This is helped with anxiety. I'm going to increase Wellbutrin to 300 mg XL daily. Would avoid to change Xanax to Klonopin 0.5 mg and she may try half tablet at bedtime.  Work stress seems to be a bit better. She feels that she is handling the stress better.    Review of Systems     Objective:   Physical Exam  Spent  20 minutes Speaking with her about management of insomnia and anxiety.       Assessment & Plan:  Anxiety  Insomnia  Plan: Klonopin 0.5 mg 1/2-1 tablet by mouth daily at bedtime when necessary  Increase Wellbutrin to 300 mg XL from 150 mg XL. She will call with progress report in 4 weeks.

## 2016-04-06 ENCOUNTER — Other Ambulatory Visit: Payer: Self-pay

## 2016-04-06 ENCOUNTER — Telehealth: Payer: Self-pay | Admitting: Internal Medicine

## 2016-04-06 MED ORDER — QUETIAPINE FUMARATE 50 MG PO TABS: 50.0000 mg | ORAL_TABLET | Freq: Every day | ORAL | 0 refills | Status: DC

## 2016-04-06 NOTE — Telephone Encounter (Signed)
DONE. PT AWARE.

## 2016-04-06 NOTE — Telephone Encounter (Signed)
Change to Seroquel 50 mg hs #30 with no refill and discontinue Klonopin.

## 2016-04-06 NOTE — Telephone Encounter (Signed)
Calling back with update on insomnia.  The change to Klonopin.  This medication is not working at all.   Pharmacy:  [facility] (if calling something in today)   Patient hasn't tried Gans or anything of that nature before.    Best contact # for patient:  [phone]

## 2016-05-14 ENCOUNTER — Other Ambulatory Visit: Payer: Self-pay | Admitting: Internal Medicine

## 2016-12-21 ENCOUNTER — Other Ambulatory Visit: Payer: Self-pay | Admitting: Internal Medicine

## 2017-04-26 ENCOUNTER — Encounter: Payer: Self-pay | Admitting: Internal Medicine

## 2017-04-26 ENCOUNTER — Ambulatory Visit (INDEPENDENT_AMBULATORY_CARE_PROVIDER_SITE_OTHER): Payer: PRIVATE HEALTH INSURANCE | Admitting: Internal Medicine

## 2017-04-26 VITALS — BP 118/90 | HR 97 | Ht 63.0 in | Wt 173.6 lb

## 2017-04-26 DIAGNOSIS — Z0000 Encounter for general adult medical examination without abnormal findings: Secondary | ICD-10-CM | POA: Diagnosis not present

## 2017-04-26 DIAGNOSIS — L219 Seborrheic dermatitis, unspecified: Secondary | ICD-10-CM

## 2017-04-26 DIAGNOSIS — E039 Hypothyroidism, unspecified: Secondary | ICD-10-CM

## 2017-04-26 DIAGNOSIS — I10 Essential (primary) hypertension: Secondary | ICD-10-CM

## 2017-04-26 DIAGNOSIS — N6019 Diffuse cystic mastopathy of unspecified breast: Secondary | ICD-10-CM | POA: Diagnosis not present

## 2017-04-26 DIAGNOSIS — Z6830 Body mass index (BMI) 30.0-30.9, adult: Secondary | ICD-10-CM

## 2017-04-26 DIAGNOSIS — Z8639 Personal history of other endocrine, nutritional and metabolic disease: Secondary | ICD-10-CM | POA: Diagnosis not present

## 2017-04-26 LAB — POCT URINALYSIS DIPSTICK
Appearance: NORMAL
Bilirubin, UA: NEGATIVE
Blood, UA: NEGATIVE
Glucose, UA: NEGATIVE
Ketones, UA: NEGATIVE
Leukocytes, UA: NEGATIVE
Nitrite, UA: NEGATIVE
Odor: NORMAL
Protein, UA: NEGATIVE
Spec Grav, UA: 1.015 (ref 1.010–1.025)
Urobilinogen, UA: 0.2 E.U./dL
pH, UA: 6 (ref 5.0–8.0)

## 2017-04-26 MED ORDER — KETOCONAZOLE 2 % EX CREA: 1.0000 "application " | TOPICAL_CREAM | Freq: Every day | CUTANEOUS | 0 refills | Status: DC

## 2017-04-26 MED ORDER — LOSARTAN POTASSIUM 25 MG PO TABS: 25.0000 mg | ORAL_TABLET | Freq: Every day | ORAL | 3 refills | Status: DC

## 2017-04-26 MED ORDER — QUETIAPINE FUMARATE 50 MG PO TABS: 50.0000 mg | ORAL_TABLET | Freq: Every day | ORAL | 5 refills | Status: DC

## 2017-04-26 MED ORDER — BUPROPION HCL ER (XL) 300 MG PO TB24: 300.0000 mg | ORAL_TABLET | Freq: Every day | ORAL | 3 refills | Status: DC

## 2017-04-26 NOTE — Progress Notes (Signed)
   Subjective:    Patient ID: Jane Avila, female    DOB: 02-22-1971, 47 y.o.   MRN: [id number]  HPI 47 year old Female in today for health maintenance exam and evaluation of medical issues.  She has a history of essential hypertension.  History of hypothyroidism.  She had postpartum thyroiditis in 2005 with anti-TPO antibody strongly positive.  Subsequently TSH went up to 28.171 by September 2005 and she was started on thyroid replacement.  Her second pregnancy was lost to 20 weeks in March 2007.  Third pregnancy apparently had normal thyroid levels in 2008.  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  Weight in 2017 was 190 pounds.  Now weighs 173 pounds 9 ounces.  BMI has decreased from 33.66-30.75.  Has seen Dr. Marga Hoots for weight management in [facility] through [facility].  Status post knee surgery 1989.  In the summer 2015 she had robotic assisted TAH/BSO by Dr. Ronita Hipps.  History of migraine headaches.  History of mild attention deficit disorder.  Social history: Married.  Non-smoker.  Social alcohol consumption.  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       Review of Systems  Respiratory: Negative.   Cardiovascular: Negative.   Gastrointestinal: Negative.   Genitourinary: Negative.   Neurological:       History of migraine headaches not as frequent  Psychiatric/Behavioral:       Issues with insomnia long-standing       Objective:   Physical Exam  Constitutional: She appears well-developed and well-nourished.  Genitourinary:  Genitourinary Comments: Deferred to GYN  Vitals reviewed.         Assessment & Plan:  History of thyroiditis with subsequent development of  hypothyroidism followed by Dr. Elyse Hsu.  TSH is stable.  Continue thyroid replacement.  History of migraine headaches  Essential hypertension-stable on current regimen  Congratulations on weight loss.  Continue with diet and exercise  History of fibrocystic breast disease  Seborrhea-prescribed Nizoral cream  Plan: She brings in lab work done through [facility] showing a normal CBC, normal C met, normal lipid panel and TSH of 2.757.  These labs were done April 22, 2017.  Vitamin D level is 31 which is fairly normal.  She does take vitamin D supplement.  She will continue same medications and return in 1 year or as needed.  She will continue to monitor her blood pressure and let me know if elevated.  It is stable today at 118/90.  She takes Wellbutrin and Seroquel.  She takes losartan 25 mg daily.

## 2017-05-15 NOTE — Patient Instructions (Signed)
It was a pleasure to see you today.  Continue diet exercise and weight loss.  Continue same medications and return in 1 year or as needed.

## 2018-05-22 ENCOUNTER — Telehealth: Payer: Self-pay | Admitting: Internal Medicine

## 2018-05-22 NOTE — Telephone Encounter (Signed)
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

## 2018-05-28 ENCOUNTER — Other Ambulatory Visit: Payer: Self-pay | Admitting: Internal Medicine

## 2018-05-28 DIAGNOSIS — N6019 Diffuse cystic mastopathy of unspecified breast: Secondary | ICD-10-CM

## 2018-05-28 DIAGNOSIS — E559 Vitamin D deficiency, unspecified: Secondary | ICD-10-CM

## 2018-05-28 DIAGNOSIS — L219 Seborrheic dermatitis, unspecified: Secondary | ICD-10-CM

## 2018-05-28 DIAGNOSIS — Z13220 Encounter for screening for lipoid disorders: Secondary | ICD-10-CM

## 2018-05-28 DIAGNOSIS — E039 Hypothyroidism, unspecified: Secondary | ICD-10-CM

## 2018-05-28 DIAGNOSIS — I10 Essential (primary) hypertension: Secondary | ICD-10-CM

## 2018-05-28 DIAGNOSIS — Z0000 Encounter for general adult medical examination without abnormal findings: Secondary | ICD-10-CM

## 2018-05-28 NOTE — Telephone Encounter (Signed)
Mailed

## 2018-06-02 NOTE — Progress Notes (Signed)
Are these done

## 2018-07-25 ENCOUNTER — Encounter: Payer: PRIVATE HEALTH INSURANCE | Admitting: Internal Medicine

## 2018-09-04 ENCOUNTER — Other Ambulatory Visit: Payer: Self-pay | Admitting: Obstetrics and Gynecology

## 2018-09-04 DIAGNOSIS — R928 Other abnormal and inconclusive findings on diagnostic imaging of breast: Secondary | ICD-10-CM

## 2018-10-31 ENCOUNTER — Encounter: Payer: PRIVATE HEALTH INSURANCE | Admitting: Internal Medicine

## 2018-11-06 ENCOUNTER — Other Ambulatory Visit: Payer: Self-pay

## 2018-11-06 ENCOUNTER — Encounter

## 2018-11-06 ENCOUNTER — Ambulatory Visit (INDEPENDENT_AMBULATORY_CARE_PROVIDER_SITE_OTHER): Payer: PRIVATE HEALTH INSURANCE | Admitting: Internal Medicine

## 2018-11-06 ENCOUNTER — Encounter: Payer: Self-pay | Admitting: Internal Medicine

## 2018-11-06 VITALS — BP 130/80 | HR 90 | Temp 98.2°F | Ht 63.0 in | Wt 190.0 lb

## 2018-11-06 DIAGNOSIS — Z8639 Personal history of other endocrine, nutritional and metabolic disease: Secondary | ICD-10-CM | POA: Diagnosis not present

## 2018-11-06 DIAGNOSIS — Z23 Encounter for immunization: Secondary | ICD-10-CM

## 2018-11-06 DIAGNOSIS — N6019 Diffuse cystic mastopathy of unspecified breast: Secondary | ICD-10-CM

## 2018-11-06 DIAGNOSIS — Z87898 Personal history of other specified conditions: Secondary | ICD-10-CM

## 2018-11-06 DIAGNOSIS — E039 Hypothyroidism, unspecified: Secondary | ICD-10-CM | POA: Diagnosis not present

## 2018-11-06 DIAGNOSIS — Z0000 Encounter for general adult medical examination without abnormal findings: Secondary | ICD-10-CM | POA: Diagnosis not present

## 2018-11-06 DIAGNOSIS — I10 Essential (primary) hypertension: Secondary | ICD-10-CM

## 2018-11-06 DIAGNOSIS — F988 Other specified behavioral and emotional disorders with onset usually occurring in childhood and adolescence: Secondary | ICD-10-CM

## 2018-11-06 LAB — POCT URINALYSIS DIPSTICK
Appearance: NEGATIVE
Bilirubin, UA: NEGATIVE
Blood, UA: NEGATIVE
Glucose, UA: NEGATIVE
Ketones, UA: NEGATIVE
Leukocytes, UA: NEGATIVE
Nitrite, UA: NEGATIVE
Odor: NEGATIVE
Protein, UA: NEGATIVE
Spec Grav, UA: 1.015 (ref 1.010–1.025)
Urobilinogen, UA: 0.2 E.U./dL
pH, UA: 6.5 (ref 5.0–8.0)

## 2018-11-06 NOTE — Progress Notes (Signed)
   Subjective:    Patient ID: Jane Avila, female    DOB: 1970-06-21, 48 y.o.   MRN: [id number]  HPI  48 year old Female for health maintenance exam of medical issues.  Patient is putting on weight during COVID-19 pandemic.  She has a history of hypertension and hypothyroidism.  Hypothyroidism is followed by Dr. Elyse Hsu.  She had postpartum thyroiditis in 2005 with  anti-TPO antibody strongly positive.  Subsequent TSH increased to 28.171 by September 2005 and she was started on thyroid replacement.  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  In 2017 she weighed 190 pounds.  In 2019 weight 173 pounds 9 ounces.  Now weighs 190 pounds and BMI is 33.66.  Have recommended Dr. Migdalia Dk clinic.  She has tried weight management through [facility] but was not really pleased with that program.  Status post knee surgery 1999.  In the summer 2015 she had robotic assisted TAH H BSO by Dr. Ronita Hipps.  History of mild attention deficit disorder.  History of migraine headaches.  Social history: Married: Non-smoker.  Social alcohol consumption.  Has been having her home redone for the past several months and admits that is been stressful.  It is still not finished.       Review of Systems see above     Objective:   Physical Exam Blood pressure 130/80 weight 190 pounds BMI 33.66 pulse 90 and regular temperature 98.2 degrees orally pulse oximetry 97%.  Skin warm and dry.  Nodes none.  TMs and pharynx are clear.  Neck is supple.  No thyromegaly.  Breasts and GYN exam deferred to GYN physician.  Cardiac exam regular rate and rhythm.  Extremities without edema.  Neuro no focal deficits on brief neurological exam.  Affect thought process and judgment appear to be normal       Assessment & Plan:  History of migraine  headaches  Essential hypertension-stable on current regimen  BMI 33.66-have recommended Dr. Migdalia Dk clinic.  Try diet and exercise for weight loss.  History of fibrocystic breast disease  History of thyroiditis with subsequent development of hypothyroidism followed by Dr. Elyse Hsu.  TSH is stable.    History of attention deficit issues currently treated just with Wellbutrin  History of insomnia previously treated with Seroquel  Plan: Continue current medication for hypertension.  Continue thyroid replacement.  She has a history of insomnia for which she has taken Seroquel .  Also has taken Wellbutrin for attention deficit and stress.  Patient had labs drawn through [facility] and will forward them to my office.  Tetanus immunization update given today.

## 2018-11-07 NOTE — Patient Instructions (Addendum)
It was a pleasure to see you today.  Work on diet exercise and weight loss.  Please forward your recent labs done through [facility] to my office.  Tetanus immunization update given today.

## 2018-12-11 ENCOUNTER — Other Ambulatory Visit: Payer: Self-pay | Admitting: Internal Medicine

## 2019-01-01 ENCOUNTER — Telehealth: Payer: Self-pay | Admitting: Internal Medicine

## 2019-01-01 MED ORDER — LOSARTAN POTASSIUM 25 MG PO TABS: 25.0000 mg | ORAL_TABLET | Freq: Every day | ORAL | 3 refills | Status: DC

## 2019-01-01 NOTE — Telephone Encounter (Signed)
Jane Avila [phone]  [facility]  losartan (COZAAR) 25 MG tablet   Jane Avila called to say she needs a refill on above medication.

## 2019-02-04 ENCOUNTER — Encounter (INDEPENDENT_AMBULATORY_CARE_PROVIDER_SITE_OTHER): Payer: Self-pay | Admitting: Family Medicine

## 2019-02-04 ENCOUNTER — Other Ambulatory Visit: Payer: Self-pay

## 2019-02-04 ENCOUNTER — Encounter: Payer: Self-pay | Admitting: Family Medicine

## 2019-02-04 ENCOUNTER — Ambulatory Visit (INDEPENDENT_AMBULATORY_CARE_PROVIDER_SITE_OTHER): Payer: Self-pay | Admitting: Family Medicine

## 2019-02-04 VITALS — BP 133/83 | HR 83 | Temp 98.4°F | Ht 64.0 in | Wt 188.0 lb

## 2019-02-04 DIAGNOSIS — Z1331 Encounter for screening for depression: Secondary | ICD-10-CM

## 2019-02-04 DIAGNOSIS — E039 Hypothyroidism, unspecified: Secondary | ICD-10-CM

## 2019-02-04 DIAGNOSIS — R5383 Other fatigue: Secondary | ICD-10-CM

## 2019-02-04 DIAGNOSIS — I10 Essential (primary) hypertension: Secondary | ICD-10-CM

## 2019-02-04 DIAGNOSIS — Z6832 Body mass index (BMI) 32.0-32.9, adult: Secondary | ICD-10-CM

## 2019-02-04 DIAGNOSIS — E669 Obesity, unspecified: Secondary | ICD-10-CM

## 2019-02-04 DIAGNOSIS — G4730 Sleep apnea, unspecified: Secondary | ICD-10-CM

## 2019-02-04 DIAGNOSIS — E559 Vitamin D deficiency, unspecified: Secondary | ICD-10-CM

## 2019-02-04 DIAGNOSIS — R0602 Shortness of breath: Secondary | ICD-10-CM

## 2019-02-04 DIAGNOSIS — Z0289 Encounter for other administrative examinations: Secondary | ICD-10-CM

## 2019-02-04 DIAGNOSIS — Z9189 Other specified personal risk factors, not elsewhere classified: Secondary | ICD-10-CM

## 2019-02-05 DIAGNOSIS — E66811 Obesity, class 1: Secondary | ICD-10-CM | POA: Insufficient documentation

## 2019-02-05 DIAGNOSIS — E669 Obesity, unspecified: Secondary | ICD-10-CM | POA: Insufficient documentation

## 2019-02-05 LAB — CBC WITH DIFFERENTIAL/PLATELET
Basophils Absolute: 0.1 10*3/uL (ref 0.0–0.2)
Basos: 1 %
EOS (ABSOLUTE): 0.1 10*3/uL (ref 0.0–0.4)
Eos: 2 %
Hematocrit: 43.1 % (ref 34.0–46.6)
Hemoglobin: 14.5 g/dL (ref 11.1–15.9)
Immature Grans (Abs): 0.1 10*3/uL (ref 0.0–0.1)
Immature Granulocytes: 1 %
Lymphocytes Absolute: 1.6 10*3/uL (ref 0.7–3.1)
Lymphs: 24 %
MCH: 30.2 pg (ref 26.6–33.0)
MCHC: 33.6 g/dL (ref 31.5–35.7)
MCV: 90 fL (ref 79–97)
Monocytes Absolute: 0.6 10*3/uL (ref 0.1–0.9)
Monocytes: 10 %
Neutrophils Absolute: 4.3 10*3/uL (ref 1.4–7.0)
Neutrophils: 62 %
Platelets: 311 10*3/uL (ref 150–450)
RBC: 4.8 x10E6/uL (ref 3.77–5.28)
RDW: 13 % (ref 11.7–15.4)
WBC: 6.7 10*3/uL (ref 3.4–10.8)

## 2019-02-05 LAB — COMPREHENSIVE METABOLIC PANEL
ALT: 15 IU/L (ref 0–32)
AST: 17 IU/L (ref 0–40)
Albumin/Globulin Ratio: 2 (ref 1.2–2.2)
Albumin: 4.3 g/dL (ref 3.8–4.8)
Alkaline Phosphatase: 80 IU/L (ref 39–117)
BUN/Creatinine Ratio: 15 (ref 9–23)
BUN: 12 mg/dL (ref 6–24)
Bilirubin Total: 0.5 mg/dL (ref 0.0–1.2)
CO2: 21 mmol/L (ref 20–29)
Calcium: 9 mg/dL (ref 8.7–10.2)
Chloride: 103 mmol/L (ref 96–106)
Creatinine, Ser: 0.81 mg/dL (ref 0.57–1.00)
GFR calc Af Amer: 100 mL/min/{1.73_m2} (ref >59)
GFR calc non Af Amer: 87 mL/min/{1.73_m2} (ref >59)
Globulin, Total: 2.1 g/dL (ref 1.5–4.5)
Glucose: 83 mg/dL (ref 65–99)
Potassium: 4.3 mmol/L (ref 3.5–5.2)
Sodium: 138 mmol/L (ref 134–144)
Total Protein: 6.4 g/dL (ref 6.0–8.5)

## 2019-02-05 LAB — HEMOGLOBIN A1C
Est. average glucose Bld gHb Est-mCnc: 108 mg/dL
Hgb A1c MFr Bld: 5.4 % (ref 4.8–5.6)

## 2019-02-05 LAB — INSULIN, RANDOM: INSULIN: 9.1 u[IU]/mL (ref 2.6–24.9)

## 2019-02-08 NOTE — Progress Notes (Signed)
Office: [phone]  /  Fax: [phone]   Dear Dr. Renold Avila,   Thank you for referring Jane Avila to our clinic. The following note includes my evaluation and treatment recommendations.  HPI:   Chief Complaint: OBESITY    Jane Avila has been referred by Jane Avila. Jane Genta, MD for consultation regarding her obesity and obesity related comorbidities.    Jane Avila (MR# [id number]) is a 48 y.o. female who presents on 02/04/2019 for obesity evaluation and treatment. Current BMI is Body mass index is 32.27 kg/m.Marland Kitchen Jane Avila has been struggling with her weight for many years and has been unsuccessful in either losing weight, maintaining weight loss, or reaching her healthy weight goal.     Jane Avila states she is currently in the action stage of change and ready to dedicate time achieving and maintaining a healthier weight. Jane Avila is interested in becoming our patient and working on intensive lifestyle modifications including (but not limited to) diet, exercise and weight loss.    Jane Avila states her family eats meals together she thinks her family will eat healthier with  her her desired weight loss is 43 lbs. she has been heavy most of her life she started gaining weight after getting married her heaviest weight ever was 190 lbs. she has significant food cravings issues  she sometimes skips meals she is frequently drinking liquids with calories she frequently makes poor food choices she frequently eats larger portions than normal  she struggles with emotional eating    Fatigue Jane Avila feels her energy is lower than it should be. This has worsened with weight gain and has not worsened recently. Jane Avila admits to daytime somnolence and  admits to waking up still tired. Patient is at risk for obstructive sleep apnea. Patent has a history of symptoms of daytime fatigue, morning fatigue, morning headache and hypertension. Patient generally gets 4 or 5 hours of sleep per  night, and states they generally have restless sleep. Snoring is present. Apneic episodes are not present. Epworth Sleepiness Score is 6  Dyspnea on exertion Jane Avila increasing shortness of breath with exercising and seems to be worsening over time with weight gain. She Avila getting out of breath sooner with activity than she used to. This has not gotten worse recently. Jane Avila denies orthopnea. We will order labs and indirect calorimeter today.  Hypothyroidism Jane Avila has a diagnosis of hypothyroidism. Jane Avila is on synthroid currently and she had a recent increase in her dose from 88 to 100 mcg. She denies hot or cold intolerance or palpitations.  Hypertension Jane Avila is a 48 y.o. female with hypertension. She is currently on Losartan. Jane Avila denies chest pain. She is working weight loss to help control her blood pressure with the goal of decreasing her risk of heart attack and stroke. Jane Avila blood pressure is currently at goal.  Sleep disorder breathing Jane Avila has a diagnosis of sleep disorder breathing. Epworth Sleepiness Score is 6. Snoring is present and patient admits to headaches and hypersomnia.  Depression Screen Jane Avila's Food and Mood (modified PHQ-9) score was  Depression screen PHQ 2/9 02/04/2019  Decreased Interest 1  Down, Depressed, Hopeless 2  PHQ - 2 Score 3  Altered sleeping 1  Tired, decreased energy 3  Change in appetite 0  Feeling bad or failure about yourself  1  Trouble concentrating 0  Moving slowly or fidgety/restless 0  Suicidal thoughts 0  PHQ-9 Score 8  Difficult doing work/chores Not difficult at all    ASSESSMENT AND PLAN:  Other  fatigue - Plan: EKG 12-Lead, HgB A1c, Insulin, random, CBC w/Diff/Platelet  Shortness of breath on exertion  Essential hypertension - Plan: Comprehensive Metabolic Panel (CMET)  Vitamin D deficiency  Acquired hypothyroidism  Sleep disorder breathing  Depression  screening  At risk for osteoporosis  Class 1 obesity with serious comorbidity and body mass index (BMI) of 32.0 to 32.9 in adult, unspecified obesity type  PLAN:  Fatigue Jane Avila was informed that her fatigue may be related to obesity, depression or many other causes. Labs will be ordered, and in the meanwhile Jane Avila has agreed to work on diet, exercise and weight loss to help with fatigue. Proper sleep hygiene was discussed including the need for 7-8 hours of quality sleep each night. We will check EKG and labs today.  Dyspnea on exertion Jane Avila's shortness of breath appears to be obesity related and exercise induced. She has agreed to work on weight loss and gradually increase exercise to treat her exercise induced shortness of breath. If Jane Avila follows our instructions and loses weight without improvement of her shortness of breath, we will plan to refer to pulmonology. We will monitor this condition regularly. Jane Avila agrees to this plan. We reviewed indirect calorimetry today. We will check labs and Jane Avila will follow up as directed.  Hypothyroidism Jane Avila was informed of the importance of good thyroid control to help with weight loss efforts. She was also informed that supertherapeutic thyroid levels are dangerous and will not improve weight loss results. We will check labs in 6 weeks.  Hypertension We discussed sodium restriction, working on healthy weight loss, and a regular exercise program as the means to achieve improved blood pressure control. Jane Avila agreed with this plan and agreed to follow up as directed. We will continue to monitor her blood pressure as well as her progress with the above lifestyle modifications. She will continue Losartan as prescribed and will watch for signs of hypotension as she continues her lifestyle modifications.  Vitamin D deficiency Will recheck labs.  Sleep disorder breathing We will discuss possible sleep study going forward. Vitamin  D deficiency. Reviewed.  Obstructive Sleep Apnea Risk Counseling Jane Avila was given extended (15 minutes) coronary artery disease prevention counseling today. She is 48 y.o. female and has risk factors for obstructive sleep apnea including obesity. We discussed intensive lifestyle modifications today with an emphasis on specific weight loss instructions and strategies.  Depression Screen Jane Avila had a mildly positive depression screening. Depression is commonly associated with obesity and often results in emotional eating behaviors. We will monitor this closely and work on CBT to help improve the non-hunger eating patterns. Referral to Psychology may be required if no improvement is seen as she continues in our clinic.  Obesity Jane Avila is currently in the action stage of change and her goal is to continue with weight loss efforts. I recommend Jane Avila begin the structured treatment plan as follows:  She has agreed to follow the Category 1 plan  Jane Avila has been instructed to eventually work up to a goal of 150 minutes of combined cardio and strengthening exercise per week or as tolerated, for weight loss and overall health benefits. We discussed the following Behavioral Modification Strategies today: planning for success, increase H2O intake, increasing lean protein intake, decreasing simple carbohydrates, increasing vegetables and work on meal planning and easy cooking plans   She was informed of the importance of frequent follow up visits to maximize her success with intensive lifestyle modifications for her multiple health conditions. She was informed we would  discuss her lab results at her next visit unless there is a critical issue that needs to be addressed sooner. Jane Avila agreed to keep her next visit at the agreed upon time to discuss these results.  ALLERGIES: Allergies  Allergen Reactions  . Erythromycin Anaphylaxis  . Tetracyclines & Related Anaphylaxis  . Sulfa Antibiotics  Rash    MEDICATIONS: Current Outpatient Medications on File Prior to Visit  Medication Sig Dispense Refill  . buPROPion (WELLBUTRIN XL) 300 MG 24 hr tablet TAKE 1 TABLET BY MOUTH DAILY. 90 tablet 3  . levothyroxine (SYNTHROID) 100 MCG tablet Take 100 mcg by mouth daily before breakfast.     . losartan (COZAAR) 25 MG tablet Take 1 tablet (25 mg total) by mouth daily. 90 tablet 3  . acetaminophen (TYLENOL) 500 MG tablet Take 2 tablets (1,000 mg total) by mouth now. (Patient not taking: Reported on 02/04/2019) 30 tablet 0   No current facility-administered medications on file prior to visit.     PAST MEDICAL HISTORY: Past Medical History:  Diagnosis Date  . Fibroid   . Hypertension   . Hypothyroidism   . Migraine   . Thyroid disease     PAST SURGICAL HISTORY: Past Surgical History:  Procedure Laterality Date  . BILATERAL SALPINGECTOMY Bilateral 03/01/2014   Procedure: BILATERAL SALPINGECTOMY;  Surgeon: Lovenia Kim, MD;  Location: [facility];  Service: Gynecology;  Laterality: Bilateral;  . DILATION AND CURETTAGE OF UTERUS    . KNEE ARTHROSCOPY     1991  . ROBOTIC ASSISTED TOTAL HYSTERECTOMY N/A 03/01/2014   Procedure: ROBOTIC ASSISTED TOTAL HYSTERECTOMY;  Surgeon: Lovenia Kim, MD;  Location: [facility];  Service: Gynecology;  Laterality: N/A;  . TONSILLECTOMY      SOCIAL HISTORY: Social History   Tobacco Use  . Smoking status: Former Smoker    Years: 4.00    Types: Cigarettes    Quit date: 09/22/1998    Years since quitting: 20.3  . Smokeless tobacco: Never Used  Substance Use Topics  . Alcohol use: No  . Drug use: No    FAMILY HISTORY: Family History  Problem Relation Age of Onset  . Hypertension Mother   . Hyperlipidemia Mother   . Thyroid disease Mother   . Obesity Mother   . Diabetes Father   . Hypertension Father   . Hyperlipidemia Father   . Heart disease Father   . Cancer Father   . Bipolar disorder Father   . Obesity Father     ROS: Review of  Systems  Constitutional: Positive for malaise/fatigue.  Respiratory: Positive for shortness of breath (on exertion).   Cardiovascular: Negative for chest pain, palpitations and orthopnea.  Genitourinary: Negative for frequency.  Neurological: Positive for headaches.  Endo/Heme/Allergies: Negative for polydipsia.       Negative for heat or cold intolerance  Psychiatric/Behavioral: The patient has insomnia.     PHYSICAL EXAM: Blood pressure 133/83, pulse 83, temperature 98.4 F (36.9 C), temperature source Oral, height 5' 4"  (1.626 m), weight 188 lb (85.3 kg), SpO2 98 %. Body mass index is 32.27 kg/m. Physical Exam Vitals signs reviewed.  Constitutional:      Appearance: Normal appearance. She is well-developed. She is obese.  HENT:     Head: Normocephalic and atraumatic.     Nose: Nose normal.  Eyes:     General: No scleral icterus.    Extraocular Movements: Extraocular movements intact.  Neck:     Musculoskeletal: Normal range of motion and neck supple.  Thyroid: No thyromegaly.  Cardiovascular:     Rate and Rhythm: Normal rate and regular rhythm.  Pulmonary:     Effort: Pulmonary effort is normal. No respiratory distress.  Abdominal:     Palpations: Abdomen is soft.     Tenderness: There is no abdominal tenderness.  Musculoskeletal: Normal range of motion.     Comments: Range of Motion normal in all 4 extremities  Skin:    General: Skin is warm and dry.  Neurological:     Mental Status: She is alert and oriented to person, place, and time.     Coordination: Coordination normal.  Psychiatric:        Mood and Affect: Mood normal.        Behavior: Behavior normal.     RECENT LABS AND TESTS: BMET    Component Value Date/Time   NA 138 02/04/2019 1230   K 4.3 02/04/2019 1230   CL 103 02/04/2019 1230   CO2 21 02/04/2019 1230   GLUCOSE 83 02/04/2019 1230   GLUCOSE 163 (H) 11/01/2015 1240   BUN 12 02/04/2019 1230   CREATININE 0.81 02/04/2019 1230   CREATININE  0.76 11/01/2015 1240   CALCIUM 9.0 02/04/2019 1230   GFRNONAA 87 02/04/2019 1230   GFRNONAA >89 07/06/2014 0919   GFRAA 100 02/04/2019 1230   GFRAA >89 07/06/2014 0919   Lab Results  Component Value Date   HGBA1C 5.4 02/04/2019   Lab Results  Component Value Date   INSULIN 9.1 02/04/2019   CBC    Component Value Date/Time   WBC 6.7 02/04/2019 1230   WBC 6.8 07/06/2014 0919   RBC 4.80 02/04/2019 1230   RBC 4.90 07/06/2014 0919   HGB 14.5 02/04/2019 1230   HCT 43.1 02/04/2019 1230   PLT 311 02/04/2019 1230   MCV 90 02/04/2019 1230   MCH 30.2 02/04/2019 1230   MCH 29.8 07/06/2014 0919   MCHC 33.6 02/04/2019 1230   MCHC 33.3 07/06/2014 0919   RDW 13.0 02/04/2019 1230   LYMPHSABS 1.6 02/04/2019 1230   MONOABS 0.5 07/06/2014 0919   EOSABS 0.1 02/04/2019 1230   BASOSABS 0.1 02/04/2019 1230   Iron/TIBC/Ferritin/ %Sat No results found for: IRON, TIBC, FERRITIN, IRONPCTSAT Lipid Panel     Component Value Date/Time   CHOL 163 07/06/2014 0919   TRIG 57 07/06/2014 0919   HDL 66 07/06/2014 0919   CHOLHDL 2.5 07/06/2014 0919   VLDL 11 07/06/2014 0919   LDLCALC 86 07/06/2014 0919   Hepatic Function Panel     Component Value Date/Time   PROT 6.4 02/04/2019 1230   ALBUMIN 4.3 02/04/2019 1230   AST 17 02/04/2019 1230   ALT 15 02/04/2019 1230   ALKPHOS 80 02/04/2019 1230   BILITOT 0.5 02/04/2019 1230      Component Value Date/Time   TSH 2.217 07/06/2014 0919   TSH 3.294 09/28/2011 1055   Vitamin D  Ref. Range 07/06/2014 09:19  Vitamin D, 25-Hydroxy Latest Ref Range: 30 - 100 ng/mL 23 (L)   ECG  shows NSR with a rate of 80 BPM INDIRECT CALORIMETER done today shows a VO2 of 191 and a REE of 1332.  Her calculated basal metabolic rate is 7342 thus her basal metabolic rate is worse than expected.       OBESITY BEHAVIORAL INTERVENTION VISIT  Today's visit was # 1   Starting weight: 188 lbs Starting date: 02/04/2019 Today's weight : 188 lbs Today's date:  02/04/2019 Total lbs lost to date: 0  02/04/2019  Height 5' 4"  (1.626 m)  Weight 188 lb (85.3 kg)  BMI (Calculated) 32.25  BLOOD PRESSURE - SYSTOLIC 828  BLOOD PRESSURE - DIASTOLIC 83  Waist Measurement  43 inches   Body Fat % 40.5 %  Total Body Water (lbs) 72.2 lbs  RMR 1332    ASK: We discussed the diagnosis of obesity with Jane Avila today and Delorus agreed to give [name] permission to discuss obesity behavioral modification therapy today.  ASSESS: Dyneshia has the diagnosis of obesity and her BMI today is 32.25 Atha is in the action stage of change   ADVISE: Merrillyn was educated on the multiple health risks of obesity as well as the benefit of weight loss to improve her health. She was advised of the need for long term treatment and the importance of lifestyle modifications to improve her current health and to decrease her risk of future health problems.  AGREE: Multiple dietary modification options and treatment options were discussed and  Ryenne agreed to follow the recommendations documented in the above note.  ARRANGE: Glenice was educated on the importance of frequent visits to treat obesity as outlined per CMS and USPSTF guidelines and agreed to schedule her next follow up appointment today.  Corey Skains, am acting as transcriptionist for Briscoe Deutscher, DO  I have reviewed the above documentation for accuracy and completeness, and I agree with the above. Briscoe Deutscher, DO

## 2019-02-09 ENCOUNTER — Encounter (INDEPENDENT_AMBULATORY_CARE_PROVIDER_SITE_OTHER): Payer: Self-pay | Admitting: Family Medicine

## 2019-02-18 ENCOUNTER — Encounter (INDEPENDENT_AMBULATORY_CARE_PROVIDER_SITE_OTHER): Payer: Self-pay | Admitting: Family Medicine

## 2019-02-18 ENCOUNTER — Ambulatory Visit (INDEPENDENT_AMBULATORY_CARE_PROVIDER_SITE_OTHER): Payer: BLUE CROSS/BLUE SHIELD | Admitting: Family Medicine

## 2019-02-18 ENCOUNTER — Other Ambulatory Visit: Payer: Self-pay

## 2019-02-18 VITALS — BP 149/88 | HR 71 | Temp 97.9°F | Ht 64.0 in | Wt 187.0 lb

## 2019-02-18 DIAGNOSIS — E8881 Metabolic syndrome: Secondary | ICD-10-CM

## 2019-02-18 DIAGNOSIS — I10 Essential (primary) hypertension: Secondary | ICD-10-CM | POA: Diagnosis not present

## 2019-02-18 DIAGNOSIS — E039 Hypothyroidism, unspecified: Secondary | ICD-10-CM

## 2019-02-18 DIAGNOSIS — Z6832 Body mass index (BMI) 32.0-32.9, adult: Secondary | ICD-10-CM

## 2019-02-18 DIAGNOSIS — Z9189 Other specified personal risk factors, not elsewhere classified: Secondary | ICD-10-CM

## 2019-02-18 DIAGNOSIS — E669 Obesity, unspecified: Secondary | ICD-10-CM

## 2019-02-18 DIAGNOSIS — R5383 Other fatigue: Secondary | ICD-10-CM

## 2019-02-18 NOTE — Progress Notes (Signed)
Office: [phone]  /  Fax: [phone]   HPI:   Chief Complaint: OBESITY Jane Avila is here to discuss her progress with her obesity treatment plan. She is on the Category 1 plan and is following her eating plan approximately 65 % of the time. She states she is exercising with a personal trainer for 45 minutes 2 times per week. Kimberley enjoyed Thanksgiving. She increased her salt intake and cake. She is back in the gym. She denies polyphagia or cravings.  Her weight is 187 lb (84.8 kg) today and has had a weight loss of 1 pound over a period of 2 weeks since her last visit. She has lost 1 lb since starting treatment with [name].  Hypertension Kennice Finnie is a 48 y.o. female with hypertension. Tifanny is taking Cozaar. Her blood pressure is stable and she denies chest pain, shortness of breath, or edema. She is working on weight loss to help control her blood pressure with the goal of decreasing her risk of heart attack and stroke.   Acquired Hypothyroidism, Hashimoto's Darcey has a diagnosis of hypothyroidism. She is currently on levothyroxine. She feels euthyroid. She denies hot or cold intolerance or palpitations, but does admit to ongoing fatigue.  Fatigue Iona notes daytime fatigue, and feels her energy is lower than it should be. She is taking Wellbutrin but she misses doses often.  Insulin Resistance Arta has a diagnosis of insulin resistance, mild based on her elevated fasting insulin level >5. Last insulin was 9.1. Although Tkeyah's blood glucose readings are still under good control, insulin resistance puts her at greater risk of metabolic syndrome and diabetes. She is not taking metformin currently and continues to work on diet and exercise to decrease risk of diabetes.  At risk for diabetes Laketia is at higher than average risk for developing diabetes due to her obesity and insulin resistance. She currently denies polyuria or polydipsia.  ASSESSMENT AND  PLAN:  Essential hypertension  Acquired hypothyroidism, hashimoto's  Other fatigue, daytime  Insulin resistance  At risk for diabetes mellitus  Class 1 obesity with serious comorbidity and body mass index (BMI) of 32.0 to 32.9 in adult, unspecified obesity type  PLAN:  Hypertension We discussed sodium restriction, working on healthy weight loss, and a regular exercise program as the means to achieve improved blood pressure control. Icie agreed with this plan and agreed to follow up as directed. We will continue to monitor her blood pressure as well as her progress with the above lifestyle modifications. Celenia agrees to continue her medications and will watch for signs of hypotension as she continues her lifestyle modifications. Catrinia agrees to follow up with our clinic in 2 weeks.  Acquired Hypothyroidism, Hashimoto's Elzada was informed of the importance of good thyroid control to help with weight loss efforts. She was also informed that supertheraputic thyroid levels are dangerous and will not improve weight loss results. Darion agrees to continue taking levothyroxine, and she agrees to follow up with our clinic in 2 weeks.  Fatigue Briar was informed that her fatigue may be related to obesity, depression or many other causes. Labs will be ordered, and in the meanwhile Amica has agreed to work on diet, exercise and weight loss to help with fatigue. Proper sleep hygiene was discussed including the need for 7-8 hours of quality sleep each night. I encouraged Izza to take her Wellbutrin each morning, may increase to 2 tablets PO q AM. Anderson Malta agrees to follow up with our clinic in 2 weeks.  Insulin Resistance Lorin will continue to work on weight loss, exercise, and decreasing simple carbohydrates in her diet to help decrease the risk of diabetes. We dicussed metformin including benefits and risks. She was informed that eating too many simple carbohydrates or too  many calories at one sitting increases the likelihood of GI side effects. Jametta agrees to follow up with [name] as directed to monitor her progress.  Diabetes risk counseling Reem was given extended (15 minutes) diabetes prevention counseling today. She is 48 y.o. female and has risk factors for diabetes including obesity and insulin resistance. We discussed intensive lifestyle modifications today with an emphasis on weight loss as well as increasing exercise and decreasing simple carbohydrates in her diet.  Obesity Odis is currently in the action stage of change. As such, her goal is to continue with weight loss efforts She has agreed to follow the Category 1 plan Nalda has been instructed to work up to a goal of 150 minutes of combined cardio and strengthening exercise per week or as tolerated for weight loss and overall health benefits. We discussed the following Behavioral Modification Strategies today: increasing lean protein intake, decreasing simple carbohydrates, increasing vegetables, increase H20 intake, work on meal planning and easy cooking plans, holiday eating strategies  and decrease liquid calories   Felise has agreed to follow up with our clinic in 2 weeks. She was informed of the importance of frequent follow up visits to maximize her success with intensive lifestyle modifications for her multiple health conditions.  ALLERGIES: Allergies  Allergen Reactions  . Erythromycin Anaphylaxis  . Tetracyclines & Related Anaphylaxis  . Sulfa Antibiotics Rash    MEDICATIONS: Current Outpatient Medications on File Prior to Visit  Medication Sig Dispense Refill  . acetaminophen (TYLENOL) 500 MG tablet Take 2 tablets (1,000 mg total) by mouth now. 30 tablet 0  . buPROPion (WELLBUTRIN XL) 300 MG 24 hr tablet TAKE 1 TABLET BY MOUTH DAILY. 90 tablet 3  . levothyroxine (SYNTHROID) 100 MCG tablet Take 100 mcg by mouth daily before breakfast.     . losartan (COZAAR) 25 MG  tablet Take 1 tablet (25 mg total) by mouth daily. 90 tablet 3   No current facility-administered medications on file prior to visit.     PAST MEDICAL HISTORY: Past Medical History:  Diagnosis Date  . Fibroid   . Hypertension   . Hypothyroidism   . Migraine   . Thyroid disease     PAST SURGICAL HISTORY: Past Surgical History:  Procedure Laterality Date  . BILATERAL SALPINGECTOMY Bilateral 03/01/2014   Procedure: BILATERAL SALPINGECTOMY;  Surgeon: Lovenia Kim, MD;  Location: [facility];  Service: Gynecology;  Laterality: Bilateral;  . DILATION AND CURETTAGE OF UTERUS    . KNEE ARTHROSCOPY     1991  . ROBOTIC ASSISTED TOTAL HYSTERECTOMY N/A 03/01/2014   Procedure: ROBOTIC ASSISTED TOTAL HYSTERECTOMY;  Surgeon: Lovenia Kim, MD;  Location: [facility];  Service: Gynecology;  Laterality: N/A;  . TONSILLECTOMY      SOCIAL HISTORY: Social History   Tobacco Use  . Smoking status: Former Smoker    Years: 4.00    Types: Cigarettes    Quit date: 09/22/1998    Years since quitting: 20.4  . Smokeless tobacco: Never Used  Substance Use Topics  . Alcohol use: No  . Drug use: No    FAMILY HISTORY: Family History  Problem Relation Age of Onset  . Hypertension Mother   . Hyperlipidemia Mother   . Thyroid disease Mother   .  Obesity Mother   . Diabetes Father   . Hypertension Father   . Hyperlipidemia Father   . Heart disease Father   . Cancer Father   . Bipolar disorder Father   . Obesity Father     ROS: Review of Systems  Constitutional: Positive for malaise/fatigue and weight loss.  Respiratory: Negative for shortness of breath.   Cardiovascular: Negative for chest pain and palpitations.       Negative edema  Genitourinary: Negative for frequency.  Endo/Heme/Allergies: Negative for polydipsia.       Negative hot/cold intolerance Negative polyphagia    PHYSICAL EXAM: Blood pressure (!) 149/88, pulse 71, temperature 97.9 F (36.6 C), temperature source Oral,  height 5' 4"  (1.626 m), weight 187 lb (84.8 kg), SpO2 98 %. Body mass index is 32.1 kg/m. Physical Exam Vitals signs reviewed.  Constitutional:      Appearance: Normal appearance. She is obese.  Cardiovascular:     Rate and Rhythm: Normal rate.     Pulses: Normal pulses.  Pulmonary:     Effort: Pulmonary effort is normal.     Breath sounds: Normal breath sounds.  Musculoskeletal: Normal range of motion.  Skin:    General: Skin is warm and dry.  Neurological:     Mental Status: She is alert and oriented to person, place, and time.  Psychiatric:        Mood and Affect: Mood normal.        Behavior: Behavior normal.     RECENT LABS AND TESTS: BMET    Component Value Date/Time   NA 138 02/04/2019 1230   K 4.3 02/04/2019 1230   CL 103 02/04/2019 1230   CO2 21 02/04/2019 1230   GLUCOSE 83 02/04/2019 1230   GLUCOSE 163 (H) 11/01/2015 1240   BUN 12 02/04/2019 1230   CREATININE 0.81 02/04/2019 1230   CREATININE 0.76 11/01/2015 1240   CALCIUM 9.0 02/04/2019 1230   GFRNONAA 87 02/04/2019 1230   GFRNONAA >89 07/06/2014 0919   GFRAA 100 02/04/2019 1230   GFRAA >89 07/06/2014 0919   Lab Results  Component Value Date   HGBA1C 5.4 02/04/2019   Lab Results  Component Value Date   INSULIN 9.1 02/04/2019   CBC    Component Value Date/Time   WBC 6.7 02/04/2019 1230   WBC 6.8 07/06/2014 0919   RBC 4.80 02/04/2019 1230   RBC 4.90 07/06/2014 0919   HGB 14.5 02/04/2019 1230   HCT 43.1 02/04/2019 1230   PLT 311 02/04/2019 1230   MCV 90 02/04/2019 1230   MCH 30.2 02/04/2019 1230   MCH 29.8 07/06/2014 0919   MCHC 33.6 02/04/2019 1230   MCHC 33.3 07/06/2014 0919   RDW 13.0 02/04/2019 1230   LYMPHSABS 1.6 02/04/2019 1230   MONOABS 0.5 07/06/2014 0919   EOSABS 0.1 02/04/2019 1230   BASOSABS 0.1 02/04/2019 1230   Iron/TIBC/Ferritin/ %Sat No results found for: IRON, TIBC, FERRITIN, IRONPCTSAT Lipid Panel     Component Value Date/Time   CHOL 163 07/06/2014 0919   TRIG 57  07/06/2014 0919   HDL 66 07/06/2014 0919   CHOLHDL 2.5 07/06/2014 0919   VLDL 11 07/06/2014 0919   LDLCALC 86 07/06/2014 0919   Hepatic Function Panel     Component Value Date/Time   PROT 6.4 02/04/2019 1230   ALBUMIN 4.3 02/04/2019 1230   AST 17 02/04/2019 1230   ALT 15 02/04/2019 1230   ALKPHOS 80 02/04/2019 1230   BILITOT 0.5 02/04/2019 1230  Component Value Date/Time   TSH 2.217 07/06/2014 0919   TSH 3.294 09/28/2011 1055      OBESITY BEHAVIORAL INTERVENTION VISIT  Today's visit was # 2   Starting weight: 188 lbs Starting date: 02/04/2019 Today's weight : 187 lbs Today's date: 02/18/2019 Total lbs lost to date: 1    ASK: We discussed the diagnosis of obesity with Rushie Nyhan today and Ladonya agreed to give [name] permission to discuss obesity behavioral modification therapy today.  ASSESS: Tobi has the diagnosis of obesity and her BMI today is 32.08 Bert is in the action stage of change   ADVISE: Aeisha was educated on the multiple health risks of obesity as well as the benefit of weight loss to improve her health. She was advised of the need for long term treatment and the importance of lifestyle modifications to improve her current health and to decrease her risk of future health problems.  AGREE: Multiple dietary modification options and treatment options were discussed and  Vickki agreed to follow the recommendations documented in the above note.  ARRANGE: Deanda was educated on the importance of frequent visits to treat obesity as outlined per CMS and USPSTF guidelines and agreed to schedule her next follow up appointment today.  Wilhemena Durie, am acting as transcriptionist for Briscoe Deutscher, DO  I have reviewed the above documentation for accuracy and completeness, and I agree with the above. Briscoe Deutscher, DO

## 2019-02-19 ENCOUNTER — Encounter (INDEPENDENT_AMBULATORY_CARE_PROVIDER_SITE_OTHER): Payer: Self-pay | Admitting: Family Medicine

## 2019-03-04 ENCOUNTER — Telehealth (INDEPENDENT_AMBULATORY_CARE_PROVIDER_SITE_OTHER): Payer: BLUE CROSS/BLUE SHIELD | Admitting: Family Medicine

## 2019-03-04 ENCOUNTER — Other Ambulatory Visit: Payer: Self-pay

## 2019-03-04 ENCOUNTER — Encounter (INDEPENDENT_AMBULATORY_CARE_PROVIDER_SITE_OTHER): Payer: Self-pay | Admitting: Family Medicine

## 2019-03-04 DIAGNOSIS — I10 Essential (primary) hypertension: Secondary | ICD-10-CM | POA: Diagnosis not present

## 2019-03-04 DIAGNOSIS — E669 Obesity, unspecified: Secondary | ICD-10-CM

## 2019-03-04 DIAGNOSIS — Z6832 Body mass index (BMI) 32.0-32.9, adult: Secondary | ICD-10-CM

## 2019-03-04 DIAGNOSIS — E039 Hypothyroidism, unspecified: Secondary | ICD-10-CM

## 2019-03-04 DIAGNOSIS — E8881 Metabolic syndrome: Secondary | ICD-10-CM | POA: Diagnosis not present

## 2019-03-04 MED ORDER — PHENTERMINE HCL 15 MG PO CAPS: 15.0000 mg | ORAL_CAPSULE | ORAL | 0 refills | Status: DC

## 2019-03-09 NOTE — Progress Notes (Signed)
Office: [phone]  /  Fax: [phone] TeleHealth Visit:  Jane Avila has verbally consented to this TeleHealth visit today. The patient is located at home, the provider is located at the [facility] office. The participants in this visit include the listed provider and patient. The visit was conducted today via [email or web address] (26 minutes).  HPI:  Chief Complaint: OBESITY Jane Avila is here to discuss her progress with her obesity treatment plan. She is on the Category 1 plan and states she is following her eating plan approximately 60 % of the time. She states she is exercising with a trainer for30 minutes 2 times per week and walking for 20 minutes 3 times per week.  Jane Avila is worried about the holidays and struggling to make healthy food choices. She feels she has lost 2 lbs since her last visit. She had a virtual visit with [name] today due to COVID restrictions and weather concerns.  Hypertension Jane Avila has a diagnosis of hypertension. She is currently taking Cozaar. Her blood pressure is generally controlled on low dose.  BP Readings from Last 3 Encounters:  02/18/19 (!) 149/88  02/04/19 133/83  11/06/18 130/80   Hashimoto's Hypothyroidism Jane Avila has a diagnosis of hypothyroidism. She is currently taking levothyroxine. She notes she feels euthyroid.  Lab Results  Component Value Date   TSH 2.217 07/06/2014   Insulin Resistance Jane Avila has a diagnosis of insulin resistance. She is no on any medications.  ASSESSMENT AND PLAN:  Essential hypertension  Acquired hypothyroidism, hashimoto's  Insulin resistance  Class 1 obesity with serious comorbidity and body mass index (BMI) of 32.0 to 32.9 in adult, unspecified obesity type - Plan: phentermine 15 MG capsule  PLAN:  Hypertension Jane Avila is working on healthy weight loss and exercise to improve blood pressure control. We will watch for signs of hypotension as she continues her lifestyle  modifications. Jane Avila will continue her current treatment, and we will continue to monitor with phentermine use.  Hashimoto's Hypothyroidism Jane Avila was informed of the importance of good thyroid control for overall health and that supertheraputic thyroid levels are dangerous and will not improve weight loss results. Jane Avila will continue current her treatment and we will continue to monitor.  Insulin Resistance Jane Avila will continue to work on weight loss, exercise, and decreasing simple carbohydrates to help decrease the risk of diabetes. Jane Avila agrees to follow up with [name] as directed to closely monitor her progress.  Obesity Jane Avila is currently in the action stage of change. As such, her goal is to continue with weight loss efforts. She has agreed to follow the Category 1 plan. Jane Avila has been instructed to work up to a goal of 150 minutes of combined cardio and strengthening exercise per week for weight loss and overall health benefits. We discussed the following Behavioral Modification Strategies today: increasing lean protein intake and increase H20 intake. We discussed various medication options to help Jane Avila with her weight loss efforts and we both agreed to start phentermine 15 mg PO daily #30 as needed for hyperphagia, with no refills.   Jane Avila has agreed to follow up with our clinic in 3 weeks. She was informed of the importance of frequent follow up visits to maximize her success with intensive lifestyle modifications for her multiple health conditions.  ALLERGIES: Allergies  Allergen Reactions  . Erythromycin Anaphylaxis  . Tetracyclines & Related Anaphylaxis  . Sulfa Antibiotics Rash    MEDICATIONS: Current Outpatient Medications on File Prior to Visit  Medication Sig Dispense Refill  .  acetaminophen (TYLENOL) 500 MG tablet Take 2 tablets (1,000 mg total) by mouth now. 30 tablet 0  . buPROPion (WELLBUTRIN XL) 300 MG 24 hr tablet TAKE 1 TABLET BY MOUTH  DAILY. 90 tablet 3  . levothyroxine (SYNTHROID) 100 MCG tablet Take 100 mcg by mouth daily before breakfast.     . losartan (COZAAR) 25 MG tablet Take 1 tablet (25 mg total) by mouth daily. 90 tablet 3   No current facility-administered medications on file prior to visit.    PAST MEDICAL HISTORY: Past Medical History:  Diagnosis Date  . Fibroid   . Hypertension   . Hypothyroidism   . Migraine   . Thyroid disease     PAST SURGICAL HISTORY: Past Surgical History:  Procedure Laterality Date  . BILATERAL SALPINGECTOMY Bilateral 03/01/2014   Procedure: BILATERAL SALPINGECTOMY;  Surgeon: Lovenia Kim, MD;  Location: [facility];  Service: Gynecology;  Laterality: Bilateral;  . DILATION AND CURETTAGE OF UTERUS    . KNEE ARTHROSCOPY     1991  . ROBOTIC ASSISTED TOTAL HYSTERECTOMY N/A 03/01/2014   Procedure: ROBOTIC ASSISTED TOTAL HYSTERECTOMY;  Surgeon: Lovenia Kim, MD;  Location: [facility];  Service: Gynecology;  Laterality: N/A;  . TONSILLECTOMY      SOCIAL HISTORY: Social History   Tobacco Use  . Smoking status: Former Smoker    Years: 4.00    Types: Cigarettes    Quit date: 09/22/1998    Years since quitting: 20.4  . Smokeless tobacco: Never Used  Substance Use Topics  . Alcohol use: No  . Drug use: No    FAMILY HISTORY: Family History  Problem Relation Age of Onset  . Hypertension Mother   . Hyperlipidemia Mother   . Thyroid disease Mother   . Obesity Mother   . Diabetes Father   . Hypertension Father   . Hyperlipidemia Father   . Heart disease Father   . Cancer Father   . Bipolar disorder Father   . Obesity Father     ROS: Review of Systems  Constitutional: Positive for weight loss.  Endo/Heme/Allergies:       + Hyperphagia    PHYSICAL EXAM: There were no vitals taken for this visit. There is no height or weight on file to calculate BMI.  General: Cooperative, alert, well developed, in no acute distress. HEENT: Conjunctivae and lids  unremarkable. Neck: No thyromegaly.   Lungs: Normal work of breathing. Neurologic: No focal deficits.   RECENT LABS AND TESTS: BMET    Component Value Date/Time   NA 138 02/04/2019 1230   K 4.3 02/04/2019 1230   CL 103 02/04/2019 1230   CO2 21 02/04/2019 1230   GLUCOSE 83 02/04/2019 1230   GLUCOSE 163 (H) 11/01/2015 1240   BUN 12 02/04/2019 1230   CREATININE 0.81 02/04/2019 1230   CREATININE 0.76 11/01/2015 1240   CALCIUM 9.0 02/04/2019 1230   GFRNONAA 87 02/04/2019 1230   GFRNONAA >89 07/06/2014 0919   GFRAA 100 02/04/2019 1230   GFRAA >89 07/06/2014 0919   Lab Results  Component Value Date   HGBA1C 5.4 02/04/2019   Lab Results  Component Value Date   INSULIN 9.1 02/04/2019   CBC    Component Value Date/Time   WBC 6.7 02/04/2019 1230   WBC 6.8 07/06/2014 0919   RBC 4.80 02/04/2019 1230   RBC 4.90 07/06/2014 0919   HGB 14.5 02/04/2019 1230   HCT 43.1 02/04/2019 1230   PLT 311 02/04/2019 1230   MCV 90 02/04/2019  1230   MCH 30.2 02/04/2019 1230   MCH 29.8 07/06/2014 0919   MCHC 33.6 02/04/2019 1230   MCHC 33.3 07/06/2014 0919   RDW 13.0 02/04/2019 1230   LYMPHSABS 1.6 02/04/2019 1230   MONOABS 0.5 07/06/2014 0919   EOSABS 0.1 02/04/2019 1230   BASOSABS 0.1 02/04/2019 1230   Iron/TIBC/Ferritin/ %Sat No results found for: IRON, TIBC, FERRITIN, IRONPCTSAT Lipid Panel     Component Value Date/Time   CHOL 163 07/06/2014 0919   TRIG 57 07/06/2014 0919   HDL 66 07/06/2014 0919   CHOLHDL 2.5 07/06/2014 0919   VLDL 11 07/06/2014 0919   LDLCALC 86 07/06/2014 0919   Hepatic Function Panel     Component Value Date/Time   PROT 6.4 02/04/2019 1230   ALBUMIN 4.3 02/04/2019 1230   AST 17 02/04/2019 1230   ALT 15 02/04/2019 1230   ALKPHOS 80 02/04/2019 1230   BILITOT 0.5 02/04/2019 1230      Component Value Date/Time   TSH 2.217 07/06/2014 0919   TSH 3.294 09/28/2011 1055    I, Trixie Dredge, am acting as Location manager for Briscoe Deutscher, DO  I have  reviewed the above documentation for accuracy and completeness, and I agree with the above. Briscoe Deutscher, DO

## 2019-03-25 ENCOUNTER — Other Ambulatory Visit: Payer: Self-pay

## 2019-03-26 ENCOUNTER — Other Ambulatory Visit: Payer: Self-pay

## 2019-03-26 ENCOUNTER — Telehealth (INDEPENDENT_AMBULATORY_CARE_PROVIDER_SITE_OTHER): Payer: BC Managed Care – PPO | Admitting: Family Medicine

## 2019-03-26 DIAGNOSIS — E8881 Metabolic syndrome: Secondary | ICD-10-CM | POA: Diagnosis not present

## 2019-03-26 DIAGNOSIS — E038 Other specified hypothyroidism: Secondary | ICD-10-CM | POA: Diagnosis not present

## 2019-03-26 DIAGNOSIS — Z9189 Other specified personal risk factors, not elsewhere classified: Secondary | ICD-10-CM | POA: Diagnosis not present

## 2019-03-26 DIAGNOSIS — I10 Essential (primary) hypertension: Secondary | ICD-10-CM

## 2019-03-26 DIAGNOSIS — E669 Obesity, unspecified: Secondary | ICD-10-CM

## 2019-03-26 DIAGNOSIS — Z6832 Body mass index (BMI) 32.0-32.9, adult: Secondary | ICD-10-CM

## 2019-03-28 NOTE — Progress Notes (Signed)
TeleHealth Visit:  Due to the COVID-19 pandemic, this visit was completed with telemedicine (audio/video) technology to reduce patient and provider exposure as well as to preserve personal protective equipment.   Sheilla Maris has verbally consented to this TeleHealth visit. The patient is located at home, the provider is located at the [facility] office. The participants in this visit include the listed provider and patient. The visit was conducted today via [email or web address].  Chief Complaint: OBESITY Irini Leet is here to discuss her progress with her obesity treatment plan along with follow-up of her obesity related diagnoses. Aariel is on the Category 1 Plan and states she is following her eating plan approximately 60% of the time. Carmencita states she is exercising 0 minutes 0 times per week.  Today's visit was #: 4 Starting weight: 188 lbs Starting date: 02/04/2019  Interim History: Romelle states that her husband recently tested positive for COVID-19.  Subjective:   1. Essential hypertension Review: taking Cozaar as instructed with no medication side effects noted.  She reports no chest pain, shortness of breath, edema, or headache.   BP Readings from Last 3 Encounters:  02/18/19 (!) 149/88  02/04/19 133/83  11/06/18 130/80   2. Insulin resistance Abbygail has a diagnosis of insulin resistance based on her elevated fasting insulin level >5. She continues to work on diet and exercise to decrease her risk of diabetes.  Lab Results  Component Value Date   INSULIN 9.1 02/04/2019   3. Other specified hypothyroidism Brigit is currently taking Synthroid 100 mcg daily.  Lab Results  Component Value Date   TSH 2.217 07/06/2014   4. At risk for diabetes mellitus Audriana is at higher than average risk for developing diabetes due to her obesity.   Assessment/Plan:   1. Essential hypertension Shere is working on healthy weight loss and exercise  to improve blood pressure control. We will watch for signs of hypotension as she continues her lifestyle modifications.  2. Insulin resistance Geniyah will continue to work on weight loss, exercise, and decreasing simple carbohydrates to help decrease the risk of diabetes. Quetzali agreed to follow-up with [name] as directed to closely monitor her progress.  3. Other specified hypothyroidism Patient with long-standing hypothyroidism, on levothyroxine therapy. She appears euthyroid. Orders and follow up as documented in patient record.  Counseling . Good thyroid control is important for overall health. Supratherapeutic thyroid levels are dangerous and will not improve weight loss results. . The correct way to take levothyroxine is fasting, with water, separated by at least 30 minutes from breakfast, and separated by more than 4 hours from calcium, iron, multivitamins, acid reflux medications (PPIs).   4. At risk for diabetes mellitus Marvelous was given approximately 15 minutes of diabetes education and counseling today. We discussed intensive lifestyle modifications today with an emphasis on weight loss as well as increasing exercise and decreasing simple carbohydrates in her diet. We also reviewed medication options with an emphasis on risk versus benefit of those discussed.   5. Class 1 obesity with serious comorbidity and body mass index (BMI) of 32.0 to 32.9 in adult, unspecified obesity type Ashten is currently in the action stage of change. As such, her goal is to continue with weight loss efforts. She has agreed to on the Category 1 Plan.   We discussed the following exercise goals today: For substantial health benefits, adults should do at least 150 minutes (2 hours and 30 minutes) a week of moderate-intensity, or 75 minutes (1  hour and 15 minutes) a week of vigorous-intensity aerobic physical activity, or an equivalent combination of moderate- and vigorous-intensity aerobic activity. Aerobic  activity should be performed in episodes of at least 10 minutes, and preferably, it should be spread throughout the week. Adults should also include muscle-strengthening activities that involve all major muscle groups on 2 or more days a week.  We discussed the following behavioral modification strategies today: increasing lean protein intake and meal planning and cooking strategies.  Ameliarose Shark has agreed to follow-up with our clinic in 2 weeks. She was informed of the importance of frequent follow-up visits to maximize her success with intensive lifestyle modifications for her multiple health conditions.  Objective:   VITALS: Per patient if applicable, see vitals. GENERAL: Alert and in no acute distress. CARDIOPULMONARY: No increased WOB. Speaking in clear sentences.  PSYCH: Pleasant and cooperative. Speech normal rate and rhythm. Affect is appropriate. Insight and judgement are appropriate. Attention is focused, linear, and appropriate.  NEURO: Oriented as arrived to appointment on time with no prompting.   Lab Results  Component Value Date   CREATININE 0.81 02/04/2019   BUN 12 02/04/2019   NA 138 02/04/2019   K 4.3 02/04/2019   CL 103 02/04/2019   CO2 21 02/04/2019   Lab Results  Component Value Date   ALT 15 02/04/2019   AST 17 02/04/2019   ALKPHOS 80 02/04/2019   BILITOT 0.5 02/04/2019   Lab Results  Component Value Date   HGBA1C 5.4 02/04/2019   Lab Results  Component Value Date   INSULIN 9.1 02/04/2019   Lab Results  Component Value Date   TSH 2.217 07/06/2014   Lab Results  Component Value Date   CHOL 163 07/06/2014   HDL 66 07/06/2014   LDLCALC 86 07/06/2014   TRIG 57 07/06/2014   CHOLHDL 2.5 07/06/2014   Lab Results  Component Value Date   WBC 6.7 02/04/2019   HGB 14.5 02/04/2019   HCT 43.1 02/04/2019   MCV 90 02/04/2019   PLT 311 02/04/2019   Attestation Statements:   Reviewed by clinician on day of visit: allergies, medications, problem  list, medical history, surgical history, family history, social history, and previous encounter notes.  I, Water quality scientist, CMA, am acting as Location manager for [employer], DO.  I have reviewed the above documentation for accuracy and completeness, and I agree with the above. Briscoe Deutscher, DO

## 2019-04-15 ENCOUNTER — Other Ambulatory Visit: Payer: Self-pay

## 2019-04-15 ENCOUNTER — Encounter (INDEPENDENT_AMBULATORY_CARE_PROVIDER_SITE_OTHER): Payer: Self-pay | Admitting: Family Medicine

## 2019-04-15 ENCOUNTER — Ambulatory Visit (INDEPENDENT_AMBULATORY_CARE_PROVIDER_SITE_OTHER): Payer: BC Managed Care – PPO | Admitting: Family Medicine

## 2019-04-15 VITALS — BP 135/89 | HR 78 | Temp 98.3°F | Ht 64.0 in | Wt 184.0 lb

## 2019-04-15 DIAGNOSIS — I10 Essential (primary) hypertension: Secondary | ICD-10-CM

## 2019-04-15 DIAGNOSIS — E8881 Metabolic syndrome: Secondary | ICD-10-CM | POA: Diagnosis not present

## 2019-04-15 DIAGNOSIS — E038 Other specified hypothyroidism: Secondary | ICD-10-CM | POA: Diagnosis not present

## 2019-04-15 DIAGNOSIS — Z6831 Body mass index (BMI) 31.0-31.9, adult: Secondary | ICD-10-CM

## 2019-04-15 DIAGNOSIS — E063 Autoimmune thyroiditis: Secondary | ICD-10-CM | POA: Diagnosis not present

## 2019-04-15 DIAGNOSIS — E669 Obesity, unspecified: Secondary | ICD-10-CM

## 2019-04-15 DIAGNOSIS — F3289 Other specified depressive episodes: Secondary | ICD-10-CM | POA: Diagnosis not present

## 2019-04-15 NOTE — Progress Notes (Signed)
Chief Complaint:   OBESITY Jane Avila is here to discuss her progress with her obesity treatment plan along with follow-up of her obesity related diagnoses. Jane Avila is on the Category 1 Plan and states she is following her eating plan approximately 0% of the time. Jane Avila states she is exercising for 0 minutes 0 times per week.  Today's visit was #: 5 Starting weight: 188 lbs Starting date: 02/04/2019 Today's weight: 184 lbs Today's date: 04/15/2019 Total lbs lost to date: 4 lbs Total lbs lost since last in-office visit: 3 lbs  Interim History: Jane Avila reports that she recently had COVID.  She is ready to get back to the plan.  She is starting back with her trainer tomorrow.  Subjective:   1. Essential hypertension Review: taking medications as instructed, no medication side effects noted, no chest pain on exertion, no dyspnea on exertion, no swelling of ankles.  She is taking Cozaar for blood pressure control.  BP Readings from Last 3 Encounters:  04/15/19 135/89  02/18/19 (!) 149/88  02/04/19 133/83   2. Insulin resistance Jane Avila has a diagnosis of insulin resistance based on her elevated fasting insulin level >5. She continues to work on diet and exercise to decrease her risk of diabetes.  Lab Results  Component Value Date   INSULIN 9.1 02/04/2019   Lab Results  Component Value Date   HGBA1C 5.4 02/04/2019   3. Other specified hypothyroidism Jane Avila is taking levothyroxine 100 mcg daily.  She had thyroid labs drawn today with Endocrinology.   Lab Results  Component Value Date   TSH 2.217 07/06/2014   4. Other depression, with emotional eating Jane Avila is struggling with emotional eating and using food for comfort to the extent that it is negatively impacting her health. She has been working on behavior modification techniques to help reduce her emotional eating and has been unsuccessful. She shows no sign of suicidal or homicidal ideations.  She is taking  Wellbutrin XL 300 mg daily.  Assessment/Plan:   1. Essential hypertension Jane Avila is working on healthy weight loss and exercise to improve blood pressure control. We will watch for signs of hypotension as she continues her lifestyle modifications.  2. Insulin resistance Jane Avila will continue to work on weight loss, exercise, and decreasing simple carbohydrates to help decrease the risk of diabetes. Jane Avila agreed to follow-up with [name] as directed to closely monitor her progress.  3. Other specified hypothyroidism Patient with long-standing hypothyroidism, on levothyroxine therapy. She appears euthyroid. Orders and follow up as documented in patient record.  Counseling . Good thyroid control is important for overall health. Supratherapeutic thyroid levels are dangerous and will not improve weight loss results. . The correct way to take levothyroxine is fasting, with water, separated by at least 30 minutes from breakfast, and separated by more than 4 hours from calcium, iron, multivitamins, acid reflux medications (PPIs).   4. Other depression, with emotional eating Behavior modification techniques were discussed today to help Jane Avila deal with her emotional/non-hunger eating behaviors.  Orders and follow up as documented in patient record.   5. Class 1 obesity with serious comorbidity and body mass index (BMI) of 31.0 to 31.9 in adult, unspecified obesity type Jane Avila is currently in the action stage of change. As such, her goal is to continue with weight loss efforts. She has agreed to the Category 1 Plan.   Exercise goals: For substantial health benefits, adults should do at least 150 minutes (2 hours and 30 minutes) a week of  moderate-intensity, or 75 minutes (1 hour and 15 minutes) a week of vigorous-intensity aerobic physical activity, or an equivalent combination of moderate- and vigorous-intensity aerobic activity. Aerobic activity should be performed in episodes of at least 10  minutes, and preferably, it should be spread throughout the week. Adults should also include muscle-strengthening activities that involve all major muscle groups on 2 or more days a week.  Behavioral modification strategies: increasing lean protein intake, decreasing simple carbohydrates, increasing vegetables and increasing water intake.  [name] has agreed to follow-up with our clinic in 2 weeks. She was informed of the importance of frequent follow-up visits to maximize her success with intensive lifestyle modifications for her multiple health conditions.   Objective:   Blood pressure 135/89, pulse 78, temperature 98.3 F (36.8 C), temperature source Oral, height 5' 4"  (1.626 m), weight 184 lb (83.5 kg), SpO2 97 %. Body mass index is 31.58 kg/m.  General: Cooperative, alert, well developed, in no acute distress. HEENT: Conjunctivae and lids unremarkable. Cardiovascular: Regular rhythm.  Lungs: Normal work of breathing. Neurologic: No focal deficits.   Lab Results  Component Value Date   CREATININE 0.81 02/04/2019   BUN 12 02/04/2019   NA 138 02/04/2019   K 4.3 02/04/2019   CL 103 02/04/2019   CO2 21 02/04/2019   Lab Results  Component Value Date   ALT 15 02/04/2019   AST 17 02/04/2019   ALKPHOS 80 02/04/2019   BILITOT 0.5 02/04/2019   Lab Results  Component Value Date   HGBA1C 5.4 02/04/2019   Lab Results  Component Value Date   INSULIN 9.1 02/04/2019   Lab Results  Component Value Date   TSH 2.217 07/06/2014   Lab Results  Component Value Date   CHOL 163 07/06/2014   HDL 66 07/06/2014   LDLCALC 86 07/06/2014   TRIG 57 07/06/2014   CHOLHDL 2.5 07/06/2014   Lab Results  Component Value Date   WBC 6.7 02/04/2019   HGB 14.5 02/04/2019   HCT 43.1 02/04/2019   MCV 90 02/04/2019   PLT 311 02/04/2019   Attestation Statements:   Reviewed by clinician on day of visit: allergies, medications, problem list, medical history, surgical history, family history,  social history, and previous encounter notes.  I, Water quality scientist, CMA, am acting as Location manager for [employer], DO.  I have reviewed the above documentation for accuracy and completeness, and I agree with the above. Briscoe Deutscher, DO

## 2019-04-29 ENCOUNTER — Other Ambulatory Visit: Payer: Self-pay

## 2019-04-29 ENCOUNTER — Ambulatory Visit (INDEPENDENT_AMBULATORY_CARE_PROVIDER_SITE_OTHER): Payer: BC Managed Care – PPO | Admitting: Family Medicine

## 2019-04-29 ENCOUNTER — Encounter (INDEPENDENT_AMBULATORY_CARE_PROVIDER_SITE_OTHER): Payer: Self-pay | Admitting: Family Medicine

## 2019-04-29 VITALS — BP 128/84 | HR 73 | Temp 98.0°F | Ht 64.0 in | Wt 186.0 lb

## 2019-04-29 DIAGNOSIS — E8881 Metabolic syndrome: Secondary | ICD-10-CM

## 2019-04-29 DIAGNOSIS — R609 Edema, unspecified: Secondary | ICD-10-CM

## 2019-04-29 DIAGNOSIS — E038 Other specified hypothyroidism: Secondary | ICD-10-CM | POA: Diagnosis not present

## 2019-04-29 DIAGNOSIS — I10 Essential (primary) hypertension: Secondary | ICD-10-CM

## 2019-04-29 DIAGNOSIS — Z6832 Body mass index (BMI) 32.0-32.9, adult: Secondary | ICD-10-CM

## 2019-04-29 DIAGNOSIS — F3289 Other specified depressive episodes: Secondary | ICD-10-CM

## 2019-04-29 DIAGNOSIS — Z9189 Other specified personal risk factors, not elsewhere classified: Secondary | ICD-10-CM | POA: Diagnosis not present

## 2019-04-29 DIAGNOSIS — E669 Obesity, unspecified: Secondary | ICD-10-CM

## 2019-04-29 MED ORDER — HYDROCHLOROTHIAZIDE 25 MG PO TABS: 25.0000 mg | ORAL_TABLET | Freq: Every day | ORAL | 0 refills | Status: DC | PRN

## 2019-04-29 NOTE — Progress Notes (Signed)
Chief Complaint:   OBESITY Jane Avila is here to discuss her progress with her obesity treatment plan along with follow-up of her obesity related diagnoses. [name] is on the Category 1 Plan and states she is following her eating plan approximately 40% of the time. [name] states she is exercising for 0 minutes 0 times per week.  Today's visit was #: 6 Starting weight: 188 lbs Starting date: 02/04/2019 Today's weight: 186 lbs Today's date: 04/29/2019 Total lbs lost to date: 2 lbs Total lbs lost since last in-office visit: 0  Interim History: [name] says she feels swollen.  She says it was hard to get her rings on her fingers.  Subjective:   1. Essential hypertension Review: taking medications as instructed, no medication side effects noted, no chest pain on exertion, no dyspnea on exertion, no swelling of ankles.  Taking losartan 25 mg daily.   BP Readings from Last 3 Encounters:  04/29/19 128/84  04/15/19 135/89  02/18/19 (!) 149/88   2. Insulin resistance [name] has a diagnosis of insulin resistance based on her elevated fasting insulin level >5. She continues to work on diet and exercise to decrease her risk of diabetes.  Lab Results  Component Value Date   INSULIN 9.1 02/04/2019   Lab Results  Component Value Date   HGBA1C 5.4 02/04/2019   3. Other specified hypothyroidism [name] takes levothyroxine 100 mcg daily.   Lab Results  Component Value Date   TSH 2.217 07/06/2014   4. Edema, unspecified type [name] reports that she feels swollen.  5. Other depression, with emotional eating [name] is struggling with emotional eating and using food for comfort to the extent that it is negatively impacting her health. She has been working on behavior modification techniques to help reduce her emotional eating and has been unsuccessful. She shows no sign of suicidal or homicidal ideations.  6. At risk for hyperglycemia [name] has a history of some elevated  blood glucose readings without a diagnosis of diabetes.  Assessment/Plan:   1. Essential hypertension Meygan is working on healthy weight loss and exercise to improve blood pressure control. We will watch for signs of hypotension as she continues her lifestyle modifications.  2. Insulin resistance [name] will continue to work on weight loss, exercise, and decreasing simple carbohydrates to help decrease the risk of diabetes. [name] agreed to follow-up with [name] as directed to closely monitor her progress.  3. Other specified hypothyroidism Patient with long-standing hypothyroidism, on levothyroxine therapy. She appears euthyroid. Orders and follow up as documented in patient record.  Counseling . Good thyroid control is important for overall health. Supratherapeutic thyroid levels are dangerous and will not improve weight loss results. . The correct way to take levothyroxine is fasting, with water, separated by at least 30 minutes from breakfast, and separated by more than 4 hours from calcium, iron, multivitamins, acid reflux medications (PPIs).   4. Edema, unspecified type [name] will start HCTZ 25 mg daily as needed for edema.  5. Other depression, with emotional eating Behavior modification techniques were discussed today to help [name] deal with her emotional/non-hunger eating behaviors.  Orders and follow up as documented in patient record.   6. At risk for hyperglycemia [name] was given approximately 15 minutes of counseling today regarding prevention of hyperglycemia. She was advised of hyperglycemia causes and the fact hyperglycemia is often asymptomatic. [name] was instructed to avoid skipping meals, eat regular protein rich meals and schedule low calorie but protein rich snacks as needed.  Repetitive spaced learning was employed today to elicit superior memory formation and behavioral change  7. Class 1 obesity with serious comorbidity and body mass index (BMI) of  32.0 to 32.9 in adult, unspecified obesity type [name] is currently in the action stage of change. As such, her goal is to continue with weight loss efforts. She has agreed to the Category 1 Plan.   Exercise goals: All adults should avoid inactivity. Some physical activity is better than none, and adults who participate in any amount of physical activity gain some health benefits.  Behavioral modification strategies: increasing lean protein intake and increasing water intake.  [name] has agreed to follow-up with our clinic in 2 weeks. She was informed of the importance of frequent follow-up visits to maximize her success with intensive lifestyle modifications for her multiple health conditions.   Objective:   Blood pressure 128/84, pulse 73, temperature 98 F (36.7 C), temperature source Oral, height 5' 4"  (1.626 m), weight 186 lb (84.4 kg), SpO2 97 %. Body mass index is 31.93 kg/m.  General: Cooperative, alert, well developed, in no acute distress. HEENT: Conjunctivae and lids unremarkable. Cardiovascular: Regular rhythm.  Lungs: Normal work of breathing. Neurologic: No focal deficits.   Lab Results  Component Value Date   CREATININE 0.81 02/04/2019   BUN 12 02/04/2019   NA 138 02/04/2019   K 4.3 02/04/2019   CL 103 02/04/2019   CO2 21 02/04/2019   Lab Results  Component Value Date   ALT 15 02/04/2019   AST 17 02/04/2019   ALKPHOS 80 02/04/2019   BILITOT 0.5 02/04/2019   Lab Results  Component Value Date   HGBA1C 5.4 02/04/2019   Lab Results  Component Value Date   INSULIN 9.1 02/04/2019   Lab Results  Component Value Date   TSH 2.217 07/06/2014   Lab Results  Component Value Date   CHOL 163 07/06/2014   HDL 66 07/06/2014   LDLCALC 86 07/06/2014   TRIG 57 07/06/2014   CHOLHDL 2.5 07/06/2014   Lab Results  Component Value Date   WBC 6.7 02/04/2019   HGB 14.5 02/04/2019   HCT 43.1 02/04/2019   MCV 90 02/04/2019   PLT 311 02/04/2019   Attestation  Statements:   Reviewed by clinician on day of visit: allergies, medications, problem list, medical history, surgical history, family history, social history, and previous encounter notes.  I, Water quality scientist, CMA, am acting as Location manager for [employer], DO.  I have reviewed the above documentation for accuracy and completeness, and I agree with the above. Briscoe Deutscher, DO

## 2019-05-20 ENCOUNTER — Ambulatory Visit (INDEPENDENT_AMBULATORY_CARE_PROVIDER_SITE_OTHER): Payer: BC Managed Care – PPO | Admitting: Family Medicine

## 2019-05-20 ENCOUNTER — Other Ambulatory Visit: Payer: Self-pay

## 2019-05-20 ENCOUNTER — Encounter (INDEPENDENT_AMBULATORY_CARE_PROVIDER_SITE_OTHER): Payer: Self-pay | Admitting: Family Medicine

## 2019-05-20 VITALS — BP 126/82 | HR 68 | Temp 98.4°F | Ht 64.0 in | Wt 182.0 lb

## 2019-05-20 DIAGNOSIS — Z9189 Other specified personal risk factors, not elsewhere classified: Secondary | ICD-10-CM

## 2019-05-20 DIAGNOSIS — E669 Obesity, unspecified: Secondary | ICD-10-CM

## 2019-05-20 DIAGNOSIS — I10 Essential (primary) hypertension: Secondary | ICD-10-CM

## 2019-05-20 DIAGNOSIS — F3289 Other specified depressive episodes: Secondary | ICD-10-CM

## 2019-05-20 DIAGNOSIS — E8881 Metabolic syndrome: Secondary | ICD-10-CM | POA: Diagnosis not present

## 2019-05-20 DIAGNOSIS — F439 Reaction to severe stress, unspecified: Secondary | ICD-10-CM | POA: Diagnosis not present

## 2019-05-20 DIAGNOSIS — Z6831 Body mass index (BMI) 31.0-31.9, adult: Secondary | ICD-10-CM

## 2019-05-20 DIAGNOSIS — E038 Other specified hypothyroidism: Secondary | ICD-10-CM

## 2019-05-20 NOTE — Progress Notes (Signed)
Chief Complaint:   OBESITY Jane Avila is here to discuss her progress with her obesity treatment plan along with follow-up of her obesity related diagnoses. Jane Avila is on the Category 1 Plan and states she is following her eating plan approximately 40% of the time. Jane Avila states she is exercising for 0 minutes 0 times per week.  Today's visit was #: 7 Starting weight: 188 lbs Starting date: 02/04/2019 Today's weight: 182 lbs Today's date: 05/20/2019 Total lbs lost to date: 6 lbs Total lbs lost since last in-office visit: 4 lbs  Interim History: Jane Avila's mom is in the hospital in [place].  She has been flying back and forth.  She says she has been making good choices.  Subjective:   1. Essential hypertension Review: taking medications as instructed, no medication side effects noted, no chest pain on exertion, no dyspnea on exertion, no swelling of ankles.  She takes Cozaar 25 mg daily and HCTZ 25 mg as needed for edema.  BP Readings from Last 3 Encounters:  05/20/19 126/82  04/29/19 128/84  04/15/19 135/89   2. Other specified hypothyroidism Jane Avila takes Synthroid 100 mcg daily. Endocrine ROS: negative for - skin changes, temperature intolerance or unexpected weight changes.  3. Insulin resistance Jane Avila has a diagnosis of insulin resistance based on her elevated fasting insulin level >5. She continues to work on diet and exercise to decrease her risk of diabetes.  Lab Results  Component Value Date   INSULIN 9.1 02/04/2019   Lab Results  Component Value Date   HGBA1C 5.4 02/04/2019   4. Situational stress Jane Avila is dealing with the stress of her mother being in the hospital in [place].  5. Other depression, with emotional eating Jane Avila is struggling with emotional eating and using food for comfort to the extent that it is negatively impacting her health. She has been working on behavior modification techniques to help reduce her emotional eating and has been  unsuccessful. She shows no sign of suicidal or homicidal ideations.  She is taking Wellbutrin and phentermine as needed.  Assessment/Plan:   1. Essential hypertension Jane Avila is working on healthy weight loss and exercise to improve blood pressure control. We will watch for signs of hypotension as she continues her lifestyle modifications.  2. Other specified hypothyroidism Patient with long-standing hypothyroidism, on levothyroxine therapy. She appears euthyroid. Orders and follow up as documented in patient record.  Counseling . Good thyroid control is important for overall health. Supratherapeutic thyroid levels are dangerous and will not improve weight loss results. . The correct way to take levothyroxine is fasting, with water, separated by at least 30 minutes from breakfast, and separated by more than 4 hours from calcium, iron, multivitamins, acid reflux medications (PPIs).   3. Insulin resistance Jane Avila will continue to work on weight loss, exercise, and decreasing simple carbohydrates to help decrease the risk of diabetes. Jane Avila agreed to follow-up with [name] as directed to closely monitor her progress.  4. Situational stress Will continue to monitor.  5. Other depression, with emotional eating Behavior modification techniques were discussed today to help Jane Avila deal with her emotional/non-hunger eating behaviors.  Orders and follow up as documented in patient record.   6. At risk for deficient intake of food Jane Avila was given approximately 15 minutes of deficit intake of food prevention counseling today. Jane Avila is at risk for eating too few calories based on current food recall. She was encouraged to focus on meeting caloric and protein goals according to her recommended meal  plan.   7. Class 1 obesity with serious comorbidity and body mass index (BMI) of 31.0 to 31.9 in adult, unspecified obesity type Jane Avila is currently in the action stage of change. As such, her goal  is to continue with weight loss efforts. She has agreed to the Category 1 Plan.   Exercise goals: No exercise has been prescribed at this time.  Behavioral modification strategies: increasing lean protein intake.  Jane Avila has agreed to follow-up with our clinic in 2 weeks. She was informed of the importance of frequent follow-up visits to maximize her success with intensive lifestyle modifications for her multiple health conditions.   Objective:   Blood pressure 126/82, pulse 68, temperature 98.4 F (36.9 C), temperature source Oral, height 5' 4"  (1.626 m), weight 182 lb (82.6 kg), SpO2 100 %. Body mass index is 31.24 kg/m.  General: Cooperative, alert, well developed, in no acute distress. HEENT: Conjunctivae and lids unremarkable. Cardiovascular: Regular rhythm.  Lungs: Normal work of breathing. Neurologic: No focal deficits.   Lab Results  Component Value Date   CREATININE 0.81 02/04/2019   BUN 12 02/04/2019   NA 138 02/04/2019   K 4.3 02/04/2019   CL 103 02/04/2019   CO2 21 02/04/2019   Lab Results  Component Value Date   ALT 15 02/04/2019   AST 17 02/04/2019   ALKPHOS 80 02/04/2019   BILITOT 0.5 02/04/2019   Lab Results  Component Value Date   HGBA1C 5.4 02/04/2019   Lab Results  Component Value Date   INSULIN 9.1 02/04/2019   Lab Results  Component Value Date   TSH 2.217 07/06/2014   Lab Results  Component Value Date   CHOL 163 07/06/2014   HDL 66 07/06/2014   LDLCALC 86 07/06/2014   TRIG 57 07/06/2014   CHOLHDL 2.5 07/06/2014   Lab Results  Component Value Date   WBC 6.7 02/04/2019   HGB 14.5 02/04/2019   HCT 43.1 02/04/2019   MCV 90 02/04/2019   PLT 311 02/04/2019   Attestation Statements:   Reviewed by clinician on day of visit: allergies, medications, problem list, medical history, surgical history, family history, social history, and previous encounter notes.  I, Water quality scientist, CMA, am acting as Location manager for [employer],  DO.  I have reviewed the above documentation for accuracy and completeness, and I agree with the above. Briscoe Deutscher, DO

## 2019-06-09 ENCOUNTER — Encounter (INDEPENDENT_AMBULATORY_CARE_PROVIDER_SITE_OTHER): Payer: Self-pay | Admitting: Family Medicine

## 2019-06-10 ENCOUNTER — Ambulatory Visit (INDEPENDENT_AMBULATORY_CARE_PROVIDER_SITE_OTHER): Payer: BC Managed Care – PPO | Admitting: Family Medicine

## 2019-06-10 DIAGNOSIS — Z20822 Contact with and (suspected) exposure to covid-19: Secondary | ICD-10-CM | POA: Diagnosis not present

## 2019-07-02 ENCOUNTER — Encounter (INDEPENDENT_AMBULATORY_CARE_PROVIDER_SITE_OTHER): Payer: Self-pay

## 2019-07-02 ENCOUNTER — Other Ambulatory Visit: Payer: Self-pay

## 2019-07-02 ENCOUNTER — Ambulatory Visit (INDEPENDENT_AMBULATORY_CARE_PROVIDER_SITE_OTHER): Payer: BC Managed Care – PPO | Admitting: Family Medicine

## 2019-07-02 ENCOUNTER — Encounter (INDEPENDENT_AMBULATORY_CARE_PROVIDER_SITE_OTHER): Payer: Self-pay | Admitting: Family Medicine

## 2019-07-29 ENCOUNTER — Encounter (INDEPENDENT_AMBULATORY_CARE_PROVIDER_SITE_OTHER): Payer: Self-pay | Admitting: Family Medicine

## 2019-07-29 ENCOUNTER — Other Ambulatory Visit: Payer: Self-pay

## 2019-07-29 ENCOUNTER — Ambulatory Visit (INDEPENDENT_AMBULATORY_CARE_PROVIDER_SITE_OTHER): Payer: BC Managed Care – PPO | Admitting: Family Medicine

## 2019-07-29 VITALS — BP 134/94 | HR 75 | Temp 98.2°F | Ht 64.0 in | Wt 184.0 lb

## 2019-07-29 DIAGNOSIS — E559 Vitamin D deficiency, unspecified: Secondary | ICD-10-CM

## 2019-07-29 DIAGNOSIS — Z6831 Body mass index (BMI) 31.0-31.9, adult: Secondary | ICD-10-CM

## 2019-07-29 DIAGNOSIS — E038 Other specified hypothyroidism: Secondary | ICD-10-CM

## 2019-07-29 DIAGNOSIS — Z9189 Other specified personal risk factors, not elsewhere classified: Secondary | ICD-10-CM | POA: Diagnosis not present

## 2019-07-29 DIAGNOSIS — G4709 Other insomnia: Secondary | ICD-10-CM

## 2019-07-29 DIAGNOSIS — F3289 Other specified depressive episodes: Secondary | ICD-10-CM

## 2019-07-29 DIAGNOSIS — E669 Obesity, unspecified: Secondary | ICD-10-CM

## 2019-07-29 DIAGNOSIS — I10 Essential (primary) hypertension: Secondary | ICD-10-CM | POA: Diagnosis not present

## 2019-07-29 MED ORDER — TRAZODONE HCL 50 MG PO TABS: 50.0000 mg | ORAL_TABLET | Freq: Every evening | ORAL | 0 refills | Status: DC | PRN

## 2019-07-29 MED ORDER — PHENTERMINE HCL 8 MG PO TABS: 1.0000 | ORAL_TABLET | Freq: Every day | ORAL | 0 refills | Status: DC

## 2019-07-29 NOTE — Progress Notes (Signed)
Chief Complaint:   OBESITY Jane Avila is here to discuss her progress with her obesity treatment plan along with follow-up of her obesity related diagnoses. Jane Avila is on the Category 1 Plan and states she is following her eating plan approximately 0% of the time. Jane Avila states she is exercising for 0 minutes 0 times per week.  Today's visit was #: 8 Starting weight: 188 lbs Starting date: 02/04/2019 Today's weight: 184 lbs Today's date: 07/29/2019 Total lbs lost to date: 4 lbs Total lbs lost since last in-office visit: 0  Interim History: Jane Avila reports that the phentermine is causing worsening insomnia.  It has been 2 months since her last follow-up.  She has been traveling to take care of her mother.  She says she is ready to get back to the plan.  She just bought Therap[employer], occupational for exercise.  Subjective:   1. Essential hypertension Review: taking medications as instructed, no medication side effects noted, no chest pain on exertion, no dyspnea on exertion, no swelling of ankles.  Blood pressure is elevated today, but sh has not taken her medication.  It is usually at goal.  BP Readings from Last 3 Encounters:  07/29/19 (!) 134/94  05/20/19 126/82  04/29/19 128/84   2. Other specified hypothyroidism Jane Avila is followed by Endocrinology at [facility].  She is taking levothyroxine 100 mcg daily.   3. Other insomnia Jane Avila has difficulty sleeping and states phentermine has made it worse.  4. Vitamin D deficiency She is currently taking no vitamin D supplement. She denies nausea, vomiting or muscle weakness.  5. Other depression, with emotional eating Jane Avila is struggling with emotional eating and using food for comfort to the extent that it is negatively impacting her health. She has been working on behavior modification techniques to help reduce her emotional eating and has been unsuccessful. She shows no sign of suicidal or homicidal ideations.  6. At risk for  constipation Jane Avila is at increased risk for constipation due to inadequate water intake, changes in diet, and/or use of medications such as GLP1 agonists. Jane Avila denies hard, infrequent stools currently.   Assessment/Plan:   1. Essential hypertension Jane Avila is working on healthy weight loss and exercise to improve blood pressure control. We will watch for signs of hypotension as she continues her lifestyle modifications.  2. Other specified hypothyroidism Patient with long-standing hypothyroidism, on levothyroxine therapy. She appears euthyroid. Orders and follow up as documented in patient record.  Counseling . Good thyroid control is important for overall health. Supratherapeutic thyroid levels are dangerous and will not improve weight loss results. . The correct way to take levothyroxine is fasting, with water, separated by at least 30 minutes from breakfast, and separated by more than 4 hours from calcium, iron, multivitamins, acid reflux medications (PPIs).   3. Other insomnia Will start Jane Avila on trazodone 50 mg at bedtime to help with insomnia.  4. Vitamin D deficiency Jane Avila will start an OTC vitamin D supplement 2000 IU daily.  5. Other depression, with emotional eating Will decrease phentermine to 8 mg daily.  Behavior modification techniques were discussed today to help Jane Avila deal with her emotional/non-hunger eating behaviors.  Orders and follow up as documented in patient record.   6. At risk for constipation Jane Avila was given approximately 15 minutes of counseling today regarding prevention of constipation. She was encouraged to increase water and fiber intake.   7. Class 1 obesity with serious comorbidity and body mass index (BMI) of 31.0 to 31.9  in adult, unspecified obesity type Jane Avila is currently in the action stage of change. As such, her goal is to continue with weight loss efforts. She has agreed to the Category 1 Plan.   Exercise goals: For  substantial health benefits, adults should do at least 150 minutes (2 hours and 30 minutes) a week of moderate-intensity, or 75 minutes (1 hour and 15 minutes) a week of vigorous-intensity aerobic physical activity, or an equivalent combination of moderate- and vigorous-intensity aerobic activity. Aerobic activity should be performed in episodes of at least 10 minutes, and preferably, it should be spread throughout the week.  Behavioral modification strategies: increasing lean protein intake, decreasing simple carbohydrates, increasing vegetables and increasing water intake.  Jane Avila has agreed to follow-up with our clinic in 2 weeks. She was informed of the importance of frequent follow-up visits to maximize her success with intensive lifestyle modifications for her multiple health conditions.   Objective:   Blood pressure (!) 134/94, pulse 75, temperature 98.2 F (36.8 C), temperature source Oral, height 5' 4"  (1.626 m), weight 184 lb (83.5 kg), SpO2 97 %. Body mass index is 31.58 kg/m.  General: Cooperative, alert, well developed, in no acute distress. HEENT: Conjunctivae and lids unremarkable. Cardiovascular: Regular rhythm.  Lungs: Normal work of breathing. Neurologic: No focal deficits.   Lab Results  Component Value Date   CREATININE 0.81 02/04/2019   BUN 12 02/04/2019   NA 138 02/04/2019   K 4.3 02/04/2019   CL 103 02/04/2019   CO2 21 02/04/2019   Lab Results  Component Value Date   ALT 15 02/04/2019   AST 17 02/04/2019   ALKPHOS 80 02/04/2019   BILITOT 0.5 02/04/2019   Lab Results  Component Value Date   HGBA1C 5.4 02/04/2019   Lab Results  Component Value Date   INSULIN 9.1 02/04/2019   Lab Results  Component Value Date   TSH 2.217 07/06/2014   Lab Results  Component Value Date   CHOL 163 07/06/2014   HDL 66 07/06/2014   LDLCALC 86 07/06/2014   TRIG 57 07/06/2014   CHOLHDL 2.5 07/06/2014   Lab Results  Component Value Date   WBC 6.7 02/04/2019    HGB 14.5 02/04/2019   HCT 43.1 02/04/2019   MCV 90 02/04/2019   PLT 311 02/04/2019   Attestation Statements:   Reviewed by clinician on day of visit: allergies, medications, problem list, medical history, surgical history, family history, social history, and previous encounter notes.  I, Water quality scientist, CMA, am acting as Location manager for [employer], DO.  I have reviewed the above documentation for accuracy and completeness, and I agree with the above. Briscoe Deutscher, DO

## 2019-08-26 ENCOUNTER — Ambulatory Visit (INDEPENDENT_AMBULATORY_CARE_PROVIDER_SITE_OTHER): Payer: BC Managed Care – PPO | Admitting: Family Medicine

## 2019-09-09 ENCOUNTER — Encounter (INDEPENDENT_AMBULATORY_CARE_PROVIDER_SITE_OTHER): Payer: Self-pay

## 2019-09-09 ENCOUNTER — Ambulatory Visit (INDEPENDENT_AMBULATORY_CARE_PROVIDER_SITE_OTHER): Payer: BC Managed Care – PPO | Admitting: Family Medicine

## 2019-09-09 ENCOUNTER — Other Ambulatory Visit: Payer: Self-pay

## 2019-09-09 ENCOUNTER — Encounter (INDEPENDENT_AMBULATORY_CARE_PROVIDER_SITE_OTHER): Payer: Self-pay | Admitting: Family Medicine

## 2019-09-09 VITALS — BP 147/93 | HR 70 | Temp 98.1°F | Ht 64.0 in | Wt 180.0 lb

## 2019-09-09 DIAGNOSIS — I10 Essential (primary) hypertension: Secondary | ICD-10-CM

## 2019-09-09 DIAGNOSIS — Z1231 Encounter for screening mammogram for malignant neoplasm of breast: Secondary | ICD-10-CM | POA: Diagnosis not present

## 2019-09-09 DIAGNOSIS — Z6831 Body mass index (BMI) 31.0-31.9, adult: Secondary | ICD-10-CM

## 2019-09-09 DIAGNOSIS — Z01419 Encounter for gynecological examination (general) (routine) without abnormal findings: Secondary | ICD-10-CM | POA: Diagnosis not present

## 2019-09-09 DIAGNOSIS — E669 Obesity, unspecified: Secondary | ICD-10-CM | POA: Diagnosis not present

## 2019-09-09 DIAGNOSIS — Z9189 Other specified personal risk factors, not elsewhere classified: Secondary | ICD-10-CM

## 2019-09-09 DIAGNOSIS — G4709 Other insomnia: Secondary | ICD-10-CM

## 2019-09-09 DIAGNOSIS — Z6832 Body mass index (BMI) 32.0-32.9, adult: Secondary | ICD-10-CM | POA: Diagnosis not present

## 2019-09-09 MED ORDER — HYDROCHLOROTHIAZIDE 25 MG PO TABS: 25.0000 mg | ORAL_TABLET | Freq: Every day | ORAL | 0 refills | Status: DC | PRN

## 2019-09-09 MED ORDER — TRAZODONE HCL 50 MG PO TABS: 50.0000 mg | ORAL_TABLET | Freq: Every evening | ORAL | 0 refills | Status: DC | PRN

## 2019-09-09 MED ORDER — LOSARTAN POTASSIUM 25 MG PO TABS: 25.0000 mg | ORAL_TABLET | Freq: Every day | ORAL | 0 refills | Status: DC

## 2019-09-10 NOTE — Progress Notes (Signed)
Chief Complaint:   OBESITY Nohea is here to discuss her progress with her obesity treatment plan along with follow-up of her obesity related diagnoses. Zniyah is on the Category 1 Plan and states she is following her eating plan approximately 0% of the time. Nana states she is walking for 40 minutes 7 times per week.  Today's visit was #: 9 Starting weight: 188 lbs Starting date: 02/04/2019 Today's weight: 180 lbs Today's date: 09/09/2019 Total lbs lost to date: 8 Total lbs lost since last in-office visit: 4  Interim History: Katrine has done well with weight loss even on vacation. Her RMR has improved and she is tolerating her phentermine well. Her blood pressure is elevated today, however due to being out of her blood pressure medications. She getting a bit bored with her plan.  Subjective:   1. Other insomnia Rebekah is stable on trazodone, and she has a long history of insomnia and she feels the trazodone helps a little bit.  2. Essential hypertension Westyn ran out of her medications and her blood pressure is elevated today. She has a mild headache and denies chest pain.  3. At risk for heart disease Emberly is at a higher than average risk for cardiovascular disease due to obesity.   Assessment/Plan:   1. Other insomnia The problem of recurrent insomnia was discussed. Orders and follow up as documented in patient record. Counseling: Intensive lifestyle modifications are the first line treatment for this issue. We discussed several lifestyle modifications today. Alinda will continue to work on diet, exercise and weight loss efforts. We will refill trazodone for 1 month.  - traZODone (DESYREL) 50 MG tablet; Take 1 tablet (50 mg total) by mouth at bedtime as needed for sleep.  Dispense: 30 tablet; Refill: 0  2. Essential hypertension Syann is working on healthy weight loss and exercise to improve blood pressure control. We will watch for signs of  hypotension as she continues her lifestyle modifications. We will refill hydrochlorothiazide and losartan for 1 month.  - hydrochlorothiazide (HYDRODIURIL) 25 MG tablet; Take 1 tablet (25 mg total) by mouth daily as needed.  Dispense: 30 tablet; Refill: 0 - losartan (COZAAR) 25 MG tablet; Take 1 tablet (25 mg total) by mouth daily.  Dispense: 30 tablet; Refill: 0  3. At risk for heart disease Nani was given approximately 15 minutes of coronary artery disease prevention counseling today. She is 49 y.o. female and has risk factors for heart disease including obesity. We discussed intensive lifestyle modifications today with an emphasis on specific weight loss instructions and strategies.   Repetitive spaced learning was employed today to elicit superior memory formation and behavioral change.  4. Class 1 obesity with serious comorbidity and body mass index (BMI) of 31.0 to 31.9 in adult, unspecified obesity type Annaclaire is currently in the action stage of change. As such, her goal is to continue with weight loss efforts. She has agreed to the Category 1 Plan or keeping a food journal and adhering to recommended goals of 1100-1400 calories and 80+ grams of protein daily.   Noya is to restart her medications and check her blood pressure at home. Once her readings are at goal for 3 days, she agreed to message Dr. Juleen China for a phentermine refill.  No change in repeated IC today.  Exercise goals: As is.  Behavioral modification strategies: increasing lean protein intake.  Ornella has agreed to follow-up with our clinic in 3 weeks. She was informed of the importance of  frequent follow-up visits to maximize her success with intensive lifestyle modifications for her multiple health conditions.   Objective:   Blood pressure (!) 147/93, pulse 70, temperature 98.1 F (36.7 C), temperature source Oral, height 5' 4"  (1.626 m), weight 180 lb (81.6 kg), SpO2 99 %. Body mass index is 30.9  kg/m.  General: Cooperative, alert, well developed, in no acute distress. HEENT: Conjunctivae and lids unremarkable. Cardiovascular: Regular rhythm.  Lungs: Normal work of breathing. Neurologic: No focal deficits.   Lab Results  Component Value Date   CREATININE 0.81 02/04/2019   BUN 12 02/04/2019   NA 138 02/04/2019   K 4.3 02/04/2019   CL 103 02/04/2019   CO2 21 02/04/2019   Lab Results  Component Value Date   ALT 15 02/04/2019   AST 17 02/04/2019   ALKPHOS 80 02/04/2019   BILITOT 0.5 02/04/2019   Lab Results  Component Value Date   HGBA1C 5.4 02/04/2019   Lab Results  Component Value Date   INSULIN 9.1 02/04/2019   Lab Results  Component Value Date   TSH 2.217 07/06/2014   Lab Results  Component Value Date   CHOL 163 07/06/2014   HDL 66 07/06/2014   LDLCALC 86 07/06/2014   TRIG 57 07/06/2014   CHOLHDL 2.5 07/06/2014   Lab Results  Component Value Date   WBC 6.7 02/04/2019   HGB 14.5 02/04/2019   HCT 43.1 02/04/2019   MCV 90 02/04/2019   PLT 311 02/04/2019   No results found for: IRON, TIBC, FERRITIN  Attestation Statements:   Reviewed by clinician on day of visit: allergies, medications, problem list, medical history, surgical history, family history, social history, and previous encounter notes.   I, Trixie Dredge, am acting as transcriptionist for Dennard Nip, MD.  I have reviewed the above documentation for accuracy and completeness, and I agree with the above. -  Dennard Nip, MD

## 2019-09-15 ENCOUNTER — Other Ambulatory Visit (INDEPENDENT_AMBULATORY_CARE_PROVIDER_SITE_OTHER): Payer: Self-pay

## 2019-09-15 DIAGNOSIS — I10 Essential (primary) hypertension: Secondary | ICD-10-CM

## 2019-09-15 MED ORDER — LOSARTAN POTASSIUM 25 MG PO TABS: 25.0000 mg | ORAL_TABLET | Freq: Every day | ORAL | 0 refills | Status: DC

## 2019-09-15 MED ORDER — HYDROCHLOROTHIAZIDE 25 MG PO TABS: 25.0000 mg | ORAL_TABLET | Freq: Every day | ORAL | 0 refills | Status: DC | PRN

## 2019-09-23 DIAGNOSIS — Z1211 Encounter for screening for malignant neoplasm of colon: Secondary | ICD-10-CM | POA: Diagnosis not present

## 2019-09-23 DIAGNOSIS — Z1212 Encounter for screening for malignant neoplasm of rectum: Secondary | ICD-10-CM | POA: Diagnosis not present

## 2019-09-28 LAB — COLOGUARD: COLOGUARD: NEGATIVE

## 2019-09-28 LAB — EXTERNAL GENERIC LAB PROCEDURE: COLOGUARD: NEGATIVE

## 2019-10-01 ENCOUNTER — Other Ambulatory Visit: Payer: Self-pay

## 2019-10-01 ENCOUNTER — Encounter (INDEPENDENT_AMBULATORY_CARE_PROVIDER_SITE_OTHER): Payer: Self-pay | Admitting: Family Medicine

## 2019-10-01 ENCOUNTER — Ambulatory Visit (INDEPENDENT_AMBULATORY_CARE_PROVIDER_SITE_OTHER): Payer: BC Managed Care – PPO | Admitting: Family Medicine

## 2019-10-01 VITALS — BP 137/89 | HR 68 | Temp 98.1°F | Ht 64.0 in | Wt 180.0 lb

## 2019-10-01 DIAGNOSIS — E669 Obesity, unspecified: Secondary | ICD-10-CM

## 2019-10-01 DIAGNOSIS — I10 Essential (primary) hypertension: Secondary | ICD-10-CM

## 2019-10-01 DIAGNOSIS — R632 Polyphagia: Secondary | ICD-10-CM | POA: Diagnosis not present

## 2019-10-01 DIAGNOSIS — G4709 Other insomnia: Secondary | ICD-10-CM

## 2019-10-01 DIAGNOSIS — Z6831 Body mass index (BMI) 31.0-31.9, adult: Secondary | ICD-10-CM

## 2019-10-01 DIAGNOSIS — E66811 Obesity, class 1: Secondary | ICD-10-CM

## 2019-10-01 DIAGNOSIS — Z9189 Other specified personal risk factors, not elsewhere classified: Secondary | ICD-10-CM

## 2019-10-02 MED ORDER — TRAZODONE HCL 50 MG PO TABS: 50.0000 mg | ORAL_TABLET | Freq: Every evening | ORAL | 0 refills | Status: DC | PRN

## 2019-10-02 MED ORDER — HYDROCHLOROTHIAZIDE 25 MG PO TABS: 25.0000 mg | ORAL_TABLET | Freq: Every day | ORAL | 0 refills | Status: DC | PRN

## 2019-10-02 MED ORDER — LOSARTAN POTASSIUM 25 MG PO TABS: 25.0000 mg | ORAL_TABLET | Freq: Every day | ORAL | 0 refills | Status: DC

## 2019-10-02 MED ORDER — PHENTERMINE HCL 8 MG PO TABS: 1.0000 | ORAL_TABLET | Freq: Every day | ORAL | 0 refills | Status: DC

## 2019-10-03 ENCOUNTER — Encounter (INDEPENDENT_AMBULATORY_CARE_PROVIDER_SITE_OTHER): Payer: Self-pay | Admitting: Family Medicine

## 2019-10-04 MED ORDER — PHENTERMINE HCL 8 MG PO TABS: 1.0000 | ORAL_TABLET | Freq: Every day | ORAL | 0 refills | Status: DC

## 2019-10-06 NOTE — Telephone Encounter (Signed)
Please see

## 2019-10-06 NOTE — Progress Notes (Signed)
Chief Complaint:   OBESITY Jane Avila is here to discuss her progress with her obesity treatment plan along with follow-up of her obesity related diagnoses. Jane Avila is on the Category 2 Plan and states she is following her eating plan approximately 65% of the time. Jane Avila states she is walking for 30 minutes 2 times per week and doing the Mirror for 15 minutes 2 times per week.  Today's visit was #: 10 Starting weight: 188 lbs Starting date: 02/04/2019 Today's weight: 180 lbs Today's date: 10/01/2019 Total lbs lost to date: 8 lbs Total lbs lost since last in-office visit: 0  Interim History: Jane Avila is doing well.  She says she is going to [employer] next week.  She is out of her HCTZ.  Subjective:   1. Polyphagia Jane Avila endorses excessive hunger.   2. Essential hypertension Review: taking medications as instructed, no medication side effects noted, no chest pain on exertion, no dyspnea on exertion, no swelling of ankles.   BP Readings from Last 3 Encounters:  10/01/19 137/89  09/09/19 (!) 147/93  07/29/19 (!) 134/94   3. Other insomnia Jane Avila has difficulty sleeping and takes trazodone as needed.  4. At risk for heart disease Jane Avila is at a higher than average risk for cardiovascular disease due to obesity.   Assessment/Plan:   1. Polyphagia Intensive lifestyle modifications are the first line treatment for this issue. We discussed several lifestyle modifications today and she will continue to work on diet, exercise and weight loss efforts. Orders and follow up as documented in patient record.  Counseling . Polyphagia is excessive hunger. . Causes can include: low blood sugars, hypERthyroidism, PMS, lack of sleep, stress, insulin resistance, diabetes, certain medications, and diets that are deficient in protein and fiber.   Orders - Phentermine HCl 8 MG TABS; Take 1 tablet by mouth daily.  Dispense: 30 tablet; Refill: 0  2. Essential hypertension Jane Avila is  working on healthy weight loss and exercise to improve blood pressure control. We will watch for signs of hypotension as she continues her lifestyle modifications.  Orders - hydrochlorothiazide (HYDRODIURIL) 25 MG tablet; Take 1 tablet (25 mg total) by mouth daily as needed.  Dispense: 90 tablet; Refill: 0 - losartan (COZAAR) 25 MG tablet; Take 1 tablet (25 mg total) by mouth daily.  Dispense: 90 tablet; Refill: 0  3. Other insomnia We discussed several lifestyle modifications today and she will continue to work on diet, exercise and weight loss efforts.   Counseling  Limit or avoid alcohol, caffeinated beverages, and cigarettes, especially close to bedtime.   Do not eat a large meal or eat spicy foods right before bedtime. This can lead to digestive discomfort that can make it hard for you to sleep.  Keep a sleep diary to help you and your health care provider figure out what could be causing your insomnia.  . Make your bedroom a dark, comfortable place where it is easy to fall asleep. ? Put up shades or blackout curtains to block light from outside. ? Use a white noise machine to block noise. ? Keep the temperature cool. . Limit screen use before bedtime. This includes: ? Watching TV. ? Using your smartphone, tablet, or computer. . Stick to a routine that includes going to bed and waking up at the same times every day and night. This can help you fall asleep faster. Consider making a quiet activity, such as reading, part of your nighttime routine. . Try to avoid taking naps during the  day so that you sleep better at night. . Get out of bed if you are still awake after 15 minutes of trying to sleep. Keep the lights down, but try reading or doing a quiet activity. When you feel sleepy, go back to bed.  Orders - traZODone (DESYREL) 50 MG tablet; Take 1 tablet (50 mg total) by mouth at bedtime as needed for sleep.  Dispense: 90 tablet; Refill: 0  4. At risk for heart disease Jane Avila was  given approximately 15 minutes of coronary artery disease prevention counseling today. She is 49 y.o. female and has risk factors for heart disease including obesity. We discussed intensive lifestyle modifications today with an emphasis on specific weight loss instructions and strategies.   Repetitive spaced learning was employed today to elicit superior memory formation and behavioral change.  5. Class 1 obesity with serious comorbidity and body mass index (BMI) of 31.0 to 31.9 in adult, unspecified obesity type Jane Avila is currently in the action stage of change. As such, her goal is to continue with weight loss efforts. She has agreed to the Category 2 Plan.   Exercise goals: For substantial health benefits, adults should do at least 150 minutes (2 hours and 30 minutes) a week of moderate-intensity, or 75 minutes (1 hour and 15 minutes) a week of vigorous-intensity aerobic physical activity, or an equivalent combination of moderate- and vigorous-intensity aerobic activity. Aerobic activity should be performed in episodes of at least 10 minutes, and preferably, it should be spread throughout the week.  Behavioral modification strategies: increasing lean protein intake, increasing water intake and increasing high fiber foods.  Jane Avila has agreed to follow-up with our clinic in 4 weeks. She was informed of the importance of frequent follow-up visits to maximize her success with intensive lifestyle modifications for her multiple health conditions.   Objective:   Blood pressure 137/89, pulse 68, temperature 98.1 F (36.7 C), temperature source Oral, height 5' 4"  (1.626 m), weight 180 lb (81.6 kg), SpO2 98 %. Body mass index is 30.9 kg/m.  General: Cooperative, alert, well developed, in no acute distress. HEENT: Conjunctivae and lids unremarkable. Cardiovascular: Regular rhythm.  Lungs: Normal work of breathing. Neurologic: No focal deficits.   Lab Results  Component Value Date   CREATININE  0.81 02/04/2019   BUN 12 02/04/2019   NA 138 02/04/2019   K 4.3 02/04/2019   CL 103 02/04/2019   CO2 21 02/04/2019   Lab Results  Component Value Date   ALT 15 02/04/2019   AST 17 02/04/2019   ALKPHOS 80 02/04/2019   BILITOT 0.5 02/04/2019   Lab Results  Component Value Date   HGBA1C 5.4 02/04/2019   Lab Results  Component Value Date   INSULIN 9.1 02/04/2019   Lab Results  Component Value Date   TSH 2.217 07/06/2014   Lab Results  Component Value Date   CHOL 163 07/06/2014   HDL 66 07/06/2014   LDLCALC 86 07/06/2014   TRIG 57 07/06/2014   CHOLHDL 2.5 07/06/2014   Lab Results  Component Value Date   WBC 6.7 02/04/2019   HGB 14.5 02/04/2019   HCT 43.1 02/04/2019   MCV 90 02/04/2019   PLT 311 02/04/2019   Attestation Statements:   Reviewed by clinician on day of visit: allergies, medications, problem list, medical history, surgical history, family history, social history, and previous encounter notes.  I, Water quality scientist, CMA, am acting as transcriptionist for Briscoe Deutscher, DO  I have reviewed the above documentation for accuracy and  completeness, and I agree with the above. Briscoe Deutscher, DO

## 2019-10-26 ENCOUNTER — Encounter (INDEPENDENT_AMBULATORY_CARE_PROVIDER_SITE_OTHER): Payer: Self-pay

## 2019-10-28 ENCOUNTER — Ambulatory Visit (INDEPENDENT_AMBULATORY_CARE_PROVIDER_SITE_OTHER): Payer: BC Managed Care – PPO | Admitting: Adult Health

## 2019-10-28 ENCOUNTER — Encounter (INDEPENDENT_AMBULATORY_CARE_PROVIDER_SITE_OTHER): Payer: Self-pay | Admitting: Adult Health

## 2019-10-28 ENCOUNTER — Other Ambulatory Visit: Payer: Self-pay

## 2019-10-28 VITALS — BP 140/93 | HR 90 | Temp 98.6°F | Ht 64.0 in | Wt 174.0 lb

## 2019-10-28 DIAGNOSIS — Z6830 Body mass index (BMI) 30.0-30.9, adult: Secondary | ICD-10-CM

## 2019-10-28 DIAGNOSIS — E669 Obesity, unspecified: Secondary | ICD-10-CM

## 2019-10-28 DIAGNOSIS — G4709 Other insomnia: Secondary | ICD-10-CM | POA: Diagnosis not present

## 2019-10-28 DIAGNOSIS — I10 Essential (primary) hypertension: Secondary | ICD-10-CM | POA: Diagnosis not present

## 2019-10-28 DIAGNOSIS — Z9189 Other specified personal risk factors, not elsewhere classified: Secondary | ICD-10-CM | POA: Diagnosis not present

## 2019-10-28 DIAGNOSIS — E063 Autoimmune thyroiditis: Secondary | ICD-10-CM | POA: Diagnosis not present

## 2019-10-28 DIAGNOSIS — R632 Polyphagia: Secondary | ICD-10-CM | POA: Diagnosis not present

## 2019-10-28 DIAGNOSIS — E038 Other specified hypothyroidism: Secondary | ICD-10-CM | POA: Diagnosis not present

## 2019-10-28 MED ORDER — TRAZODONE HCL 50 MG PO TABS: 50.0000 mg | ORAL_TABLET | Freq: Every evening | ORAL | 0 refills | Status: DC | PRN

## 2019-10-29 ENCOUNTER — Ambulatory Visit (INDEPENDENT_AMBULATORY_CARE_PROVIDER_SITE_OTHER): Payer: BC Managed Care – PPO | Admitting: Family Medicine

## 2019-11-02 DIAGNOSIS — R632 Polyphagia: Secondary | ICD-10-CM | POA: Insufficient documentation

## 2019-11-02 NOTE — Progress Notes (Signed)
Chief Complaint:   OBESITY Jane Avila is here to discuss her progress with her obesity treatment plan along with follow-up of her obesity related diagnoses. Jane Avila is on the Category 2 Plan and states she is following her eating plan approximately 85% of the time. Jane Avila states she is walking 25 minutes 3 times per week.  Today's visit was #: 11 Starting weight: 188 lbs Starting date: 02/04/2019 Today's weight: 174 lbs Today's date: 10/28/2019 Total lbs lost to date: 14 Total lbs lost since last in-office visit: 6  Interim History: Jane Avila is on prednisone 40 mg daily for 5 days and today is day 2 of 5. She was started on glucocorticoids to treat nocturnal bronchospasm/cough. She believes that the prednisone is causing elevation in her blood pressure today. Jane Avila denies acute cardiac symptoms. She continues to enjoy the foods and structure of the Category 2 meal plan and denies polyphagia or excessive cravings.  Subjective:   Polyphagia. On both blood pressure checks, blood pressure was above goal. EKG in November 2020 showed sinus rhythm, within normal limits. Jane Avila is on prednisone 40 mg daily, day 2 of 5 course. She denies acute cardiac symptoms. PDMP was reviewed and no aberrancies were noted.  Essential hypertension. Blood pressure was above goal on both blood pressure checks. Jane Avila is on losartan 25 mg daily and HCTZ 25 mg PRN. She estimates to use diuretic 1-2 times per week. She is on a course of prednisone. No acute cardiac symptoms.  BP Readings from Last 3 Encounters:  10/28/19 (!) 140/93  10/01/19 137/89  09/09/19 (!) 147/93   Lab Results  Component Value Date   CREATININE 0.81 02/04/2019   CREATININE 0.76 11/01/2015   CREATININE 0.69 07/06/2014   Other insomnia. Jane Avila is able to sleep 5 hours with trazodone 50 mg QHS.  At increased risk of exposure to COVID-19 virus. 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  Assessment/Plan:   Polyphagia. Jane Avila will check her blood pressure at home and send My Chart message with readings. If all readings are at goal, then will send in refill of phentermine 8 mg.  Essential hypertension. Jane Avila is working on healthy weight loss and exercise to improve blood pressure control. We will watch for signs of hypotension as she continues her lifestyle modifications.  Continue current anti-hypertensives as directed.  Limit Na+ intake.   Other insomnia. The problem of recurrent insomnia was discussed. Orders and follow up as documented in patient record. Counseling: Intensive lifestyle modifications are the first line treatment for this issue. We discussed several lifestyle modifications today and she will continue to work on diet, exercise and weight loss efforts. Refill was given for traZODone (DESYREL) 50 MG tablet 1 tab QHS PRN insomnia #30 with 0 refills. Good sleep hygiene was encouraged.  Counseling  Limit or avoid alcohol, caffeinated beverages, and cigarettes, especially close to bedtime.   Do not eat a large meal or eat spicy foods right before bedtime. This can lead to digestive discomfort that can make it hard for you to sleep.  Keep a sleep diary to help you and your health care provider figure out what could be causing your insomnia.  . Make your bedroom a dark, comfortable place where it is easy to fall asleep. ? Put up shades or blackout curtains to block light from outside. ? Use a white noise machine to block noise. ? Keep the temperature cool. . Limit screen use  before bedtime. This includes: ? Watching TV. ? Using your smartphone, tablet, or computer. . Stick to a routine that includes going to bed and waking up at the same times every day and night. This can help you fall asleep faster. Consider making a quiet activity, such as reading, part of your nighttime routine. . Try to avoid taking naps during the day so that you sleep  better at night. . Get out of bed if you are still awake after 15 minutes of trying to sleep. Keep the lights down, but try reading or doing a quiet activity. When you feel sleepy, go back to bed.  At increased risk of exposure to COVID-19 virus. Jane Avila was given approximately 15 minutes of COVID prevention counseling today. Counseling  COVID-19 is a respiratory infection that is caused by a virus. It can cause serious infections, such as pneumonia, acute respiratory distress syndrome, acute respiratory failure, or sepsis.  You are more likely to develop a serious illness if you are 70 years of age or older, have a weak immune system, live in a nursing home, have chronic disease, or have obesity.  Get vaccinated as soon as they are available to you.  For our most current information, please visit [email or web address].  Wash your hands often with soap and water for 20 seconds. If soap and water are not available, use alcohol-based hand sanitizer.  Wear a face mask. Make sure your mask covers your nose and mouth.  Maintain at least 6 feet distance from others when in public.  Get help right away if  You have trouble breathing, chest pain, confusion, or other concerning symptoms.  Repetitive spaced learning was employed today to elicit superior memory formation and behavioral change.  Class 1 obesity with serious comorbidity and body mass index (BMI) of 30.0 to 30.9 in adult, unspecified obesity type - BMI greater than 30 at start of program.  Jane Avila is currently in the action stage of change. As such, her goal is to continue with weight loss efforts. She has agreed to the Category 2 Plan.   Exercise goals: Jane Avila will continue her current exercise regimen.   Behavioral modification strategies: increasing lean protein intake, increasing water intake, no skipping meals, better snacking choices and planning for success.  Jane Avila has agreed to follow-up with our clinic  fasting in 3 weeks. She was informed of the importance of frequent follow-up visits to maximize her success with intensive lifestyle modifications for her multiple health conditions.   Objective:   Blood pressure (!) 140/93, pulse 90, temperature 98.6 F (37 C), temperature source Oral, height 5' 4"  (1.626 m), weight 174 lb (78.9 kg), SpO2 96 %. Body mass index is 29.87 kg/m.  General: Cooperative, alert, well developed, in no acute distress. HEENT: Conjunctivae and lids unremarkable. Cardiovascular: Regular rhythm.  Lungs: Normal work of breathing. Neurologic: No focal deficits.   Lab Results  Component Value Date   CREATININE 0.81 02/04/2019   BUN 12 02/04/2019   NA 138 02/04/2019   K 4.3 02/04/2019   CL 103 02/04/2019   CO2 21 02/04/2019   Lab Results  Component Value Date   ALT 15 02/04/2019   AST 17 02/04/2019   ALKPHOS 80 02/04/2019   BILITOT 0.5 02/04/2019   Lab Results  Component Value Date   HGBA1C 5.4 02/04/2019   Lab Results  Component Value Date   INSULIN 9.1 02/04/2019   Lab Results  Component Value Date   TSH 2.217 07/06/2014   Lab Results  Component Value Date   CHOL 163 07/06/2014   HDL 66 07/06/2014   LDLCALC 86 07/06/2014   TRIG 57 07/06/2014   CHOLHDL 2.5 07/06/2014   Lab Results  Component Value Date   WBC 6.7 02/04/2019   HGB 14.5 02/04/2019   HCT 43.1 02/04/2019   MCV 90 02/04/2019   PLT 311 02/04/2019   No results found for: IRON, TIBC, FERRITIN  Attestation Statements:   Reviewed by clinician on day of visit: allergies, medications, problem list, medical history, surgical history, family history, social history, and previous encounter notes.  I, Michaelene Song, am acting as Location manager for [employer], NP-C   I have reviewed the above documentation for accuracy and completeness, and I agree with the above. -  Esaw Grandchild, NP

## 2019-11-12 DIAGNOSIS — E039 Hypothyroidism, unspecified: Secondary | ICD-10-CM | POA: Diagnosis not present

## 2019-11-12 DIAGNOSIS — E063 Autoimmune thyroiditis: Secondary | ICD-10-CM | POA: Diagnosis not present

## 2019-11-18 ENCOUNTER — Encounter (INDEPENDENT_AMBULATORY_CARE_PROVIDER_SITE_OTHER): Payer: Self-pay

## 2019-11-18 ENCOUNTER — Other Ambulatory Visit: Payer: Self-pay

## 2019-11-18 ENCOUNTER — Ambulatory Visit (INDEPENDENT_AMBULATORY_CARE_PROVIDER_SITE_OTHER): Payer: BC Managed Care – PPO | Admitting: Adult Health

## 2019-11-18 ENCOUNTER — Encounter (INDEPENDENT_AMBULATORY_CARE_PROVIDER_SITE_OTHER): Payer: Self-pay | Admitting: Family Medicine

## 2019-11-18 ENCOUNTER — Ambulatory Visit (INDEPENDENT_AMBULATORY_CARE_PROVIDER_SITE_OTHER): Payer: BC Managed Care – PPO | Admitting: Family Medicine

## 2019-11-18 VITALS — BP 105/72 | HR 88 | Temp 98.1°F | Ht 64.0 in | Wt 172.0 lb

## 2019-11-18 DIAGNOSIS — E038 Other specified hypothyroidism: Secondary | ICD-10-CM | POA: Diagnosis not present

## 2019-11-18 DIAGNOSIS — Z9189 Other specified personal risk factors, not elsewhere classified: Secondary | ICD-10-CM | POA: Diagnosis not present

## 2019-11-18 DIAGNOSIS — E559 Vitamin D deficiency, unspecified: Secondary | ICD-10-CM | POA: Diagnosis not present

## 2019-11-18 DIAGNOSIS — E669 Obesity, unspecified: Secondary | ICD-10-CM

## 2019-11-18 DIAGNOSIS — G4709 Other insomnia: Secondary | ICD-10-CM

## 2019-11-18 DIAGNOSIS — E8881 Metabolic syndrome: Secondary | ICD-10-CM

## 2019-11-18 DIAGNOSIS — I10 Essential (primary) hypertension: Secondary | ICD-10-CM | POA: Diagnosis not present

## 2019-11-18 DIAGNOSIS — Z6830 Body mass index (BMI) 30.0-30.9, adult: Secondary | ICD-10-CM

## 2019-11-18 MED ORDER — PHENTERMINE HCL 8 MG PO TABS: 1.0000 | ORAL_TABLET | Freq: Every day | ORAL | 0 refills | Status: DC

## 2019-11-18 MED ORDER — TRAZODONE HCL 50 MG PO TABS: 50.0000 mg | ORAL_TABLET | Freq: Every evening | ORAL | 0 refills | Status: DC | PRN

## 2019-11-18 NOTE — Progress Notes (Signed)
Chief Complaint:   OBESITY Jane Avila is here to discuss her progress with her obesity treatment plan along with follow-up of her obesity related diagnoses. Jane Avila is on the Category 2 Plan and states she is following her eating plan approximately 70% of the time. Jane Avila states she is walking for 30 minutes 3-4 times per week.  Today's visit was #: 12 Starting weight: 188 lbs Starting date: 02/04/2019 Today's weight: 172 lbs Today's date: 11/18/2019 Total lbs lost to date: 16 lbs Total lbs lost since last in-office visit: 2 lbs Total weight loss percentage to date: -8.51%  Interim History: Jane Avila says that her polyphagia is controlled with treatment.  Work has been very busy (COVID surge).  Her recent bronchospasm has improved.  Blood pressure is normal.    Assessment/Plan:   1. Vitamin D deficiency Optimal goal > 50 ng/dL. There is also evidence to support a goal of >70 ng/dL in patients with cancer and heart disease. Plan:  Will check vitamin D level today.  - VITAMIN D 25 Hydroxy (Vit-D Deficiency, Fractures)  2. Essential hypertension At goal. Jane Avila is working on healthy weight loss and exercise to improve blood pressure control. We will watch for signs of hypotension as she continues her lifestyle modifications.   BP Readings from Last 3 Encounters:  11/18/19 105/72  10/28/19 (!) 140/93  10/01/19 137/89   - CBC with Differential/Platelet - Comprehensive metabolic panel - Lipid Panel With LDL/HDL Ratio  3. Other specified hypothyroidism Patient with long-standing hypothyroidism, on levothyroxine therapy. She appears euthyroid. Will continue to monitor symptoms as they relate to her weight loss journey.  Lab Results  Component Value Date   TSH 2.217 07/06/2014   - TSH  4. Insulin resistance Goal is HgbA1c < 5.7 and insulin level closer to 5. Jane Avila will continue to work on weight loss, exercise, and decreasing simple carbohydrates to help decrease the  risk of diabetes. Jane Avila agreed to follow-up with [name] as directed to closely monitor her progress.  Lab Results  Component Value Date   INSULIN 9.1 02/04/2019   Lab Results  Component Value Date   HGBA1C 5.4 02/04/2019   - Hemoglobin A1c - Insulin, random  5. Other insomnia The problem of recurrent insomnia was discussed. Orders and follow up as documented in patient record. Counseling: Intensive lifestyle modifications are the first line treatment for this issue. We discussed several lifestyle modifications today and she will continue to work on diet, exercise and weight loss efforts.   - traZODone (DESYREL) 50 MG tablet; Take 1 tablet (50 mg total) by mouth at bedtime as needed for sleep.  Dispense: 30 tablet; Refill: 0  6. At risk for constipation Jane Avila is at risk for constipation due to taking phentermine.  7. Class 1 obesity with serious comorbidity and body mass index (BMI) of 30.0 to 30.9 in adult, unspecified obesity type  This patient 1) has no evidence of serious cardiovascular disease; 2) does not have serious psychiatric disease or a history of substance abuse; 3) has been informed about weight loss medications that are FDA-approved for long term use and told that these have been documented to be safe and effective whereas phentermine has not; 4) does not demonstrate a clinically significant increase in pulse or BP when taking phentermine; and 5) demonstrates significant weight loss while using the medication. Patient understands that all anti-obesity medications are contraindicated in pregnancy. Pt denies a history of glaucoma. Patient understands that long-term use of phentermine is considered off-label  use of this medication, however, that the Endocrine Society and recent research supports that long-term use of phentermine does not appear to have detrimental health effects when used in the appropriate patient. In addition, a 2019 study published in Obesity Journal on [id number]  patients concluded that "recommendations to limit phentermine to less than 3 months do not align with current concepts of pharmacologic treatment of obesity", and that "long term phentermine users experience greater weight loss without apparent increases in cardiovascular risk".  We reviewed potential side effects including insomnia, dry mouth, increased heart rate and blood pressure, increased anxiety. We reviewed reducing caffeine consumption while taking phentermine, especially if the patient is experiencing side effects. The potential risks and benefits of phentermine have been reviewed with the patient, and alternative treatment options were discussed. All questions were answered, and the patient wishes to move forward with this medication.  I have consulted the [place] Controlled Substances Registry for this patient, and feel the risk/benefit ratio today is favorable for proceeding with this prescription for a controlled substance. The patient understands monitoring parameters and red flags.   -Refill Phentermine HCl 8 MG TABS; Take 1 tablet by mouth daily.  Dispense: 30 tablet; Refill: 0  Jane Avila is currently in the action stage of change. As such, her goal is to continue with weight loss efforts. She has agreed to the Category 2 Plan.   Exercise goals: For substantial health benefits, adults should do at least 150 minutes (2 hours and 30 minutes) a week of moderate-intensity, or 75 minutes (1 hour and 15 minutes) a week of vigorous-intensity aerobic physical activity, or an equivalent combination of moderate- and vigorous-intensity aerobic activity. Aerobic activity should be performed in episodes of at least 10 minutes, and preferably, it should be spread throughout the week.  Behavioral modification strategies: increasing lean protein intake.  Jane Avila has agreed to follow-up with our clinic in 3 weeks. She was informed of the importance of frequent follow-up visits to maximize her success with  intensive lifestyle modifications for her multiple health conditions.   Jane Avila was informed we would discuss her lab results at her next visit unless there is a critical issue that needs to be addressed sooner. Jane Avila agreed to keep her next visit at the agreed upon time to discuss these results.  Objective:   Blood pressure 105/72, pulse 88, temperature 98.1 F (36.7 C), temperature source Oral, height 5' 4"  (1.626 m), weight 172 lb (78 kg), SpO2 96 %. Body mass index is 29.52 kg/m.  General: Cooperative, alert, well developed, in no acute distress. HEENT: Conjunctivae and lids unremarkable. Cardiovascular: Regular rhythm.  Lungs: Normal work of breathing. Neurologic: No focal deficits.   Lab Results  Component Value Date   CREATININE 0.81 02/04/2019   BUN 12 02/04/2019   NA 138 02/04/2019   K 4.3 02/04/2019   CL 103 02/04/2019   CO2 21 02/04/2019   Lab Results  Component Value Date   ALT 15 02/04/2019   AST 17 02/04/2019   ALKPHOS 80 02/04/2019   BILITOT 0.5 02/04/2019   Lab Results  Component Value Date   HGBA1C 5.4 02/04/2019   Lab Results  Component Value Date   INSULIN 9.1 02/04/2019   Lab Results  Component Value Date   TSH 2.217 07/06/2014   Lab Results  Component Value Date   CHOL 163 07/06/2014   HDL 66 07/06/2014   LDLCALC 86 07/06/2014   TRIG 57 07/06/2014   CHOLHDL 2.5 07/06/2014   Lab Results  Component Value Date   WBC 6.7 02/04/2019   HGB 14.5 02/04/2019   HCT 43.1 02/04/2019   MCV 90 02/04/2019   PLT 311 02/04/2019   Attestation Statements:   Reviewed by clinician on day of visit: allergies, medications, problem list, medical history, surgical history, family history, social history, and previous encounter notes.  I, Water quality scientist, CMA, am acting as transcriptionist for Briscoe Deutscher, DO  I have reviewed the above documentation for accuracy and completeness, and I agree with the above. Briscoe Deutscher, DO

## 2019-11-19 ENCOUNTER — Ambulatory Visit (INDEPENDENT_AMBULATORY_CARE_PROVIDER_SITE_OTHER): Payer: BC Managed Care – PPO | Admitting: Family Medicine

## 2019-11-19 LAB — COMPREHENSIVE METABOLIC PANEL
ALT: 19 IU/L (ref 0–32)
AST: 19 IU/L (ref 0–40)
Albumin/Globulin Ratio: 2.1 (ref 1.2–2.2)
Albumin: 4.6 g/dL (ref 3.8–4.8)
Alkaline Phosphatase: 76 IU/L (ref 48–121)
BUN/Creatinine Ratio: 13 (ref 9–23)
BUN: 12 mg/dL (ref 6–24)
Bilirubin Total: 0.6 mg/dL (ref 0.0–1.2)
CO2: 24 mmol/L (ref 20–29)
Calcium: 9.3 mg/dL (ref 8.7–10.2)
Chloride: 99 mmol/L (ref 96–106)
Creatinine, Ser: 0.89 mg/dL (ref 0.57–1.00)
GFR calc Af Amer: 89 mL/min/{1.73_m2} (ref >59)
GFR calc non Af Amer: 77 mL/min/{1.73_m2} (ref >59)
Globulin, Total: 2.2 g/dL (ref 1.5–4.5)
Glucose: 95 mg/dL (ref 65–99)
Potassium: 4.2 mmol/L (ref 3.5–5.2)
Sodium: 137 mmol/L (ref 134–144)
Total Protein: 6.8 g/dL (ref 6.0–8.5)

## 2019-11-19 LAB — VITAMIN D 25 HYDROXY (VIT D DEFICIENCY, FRACTURES): Vit D, 25-Hydroxy: 37.3 ng/mL (ref 30.0–100.0)

## 2019-11-19 LAB — CBC WITH DIFFERENTIAL/PLATELET
Basophils Absolute: 0.1 10*3/uL (ref 0.0–0.2)
Basos: 1 %
EOS (ABSOLUTE): 0.2 10*3/uL (ref 0.0–0.4)
Eos: 2 %
Hematocrit: 45 % (ref 34.0–46.6)
Hemoglobin: 14.7 g/dL (ref 11.1–15.9)
Immature Grans (Abs): 0.1 10*3/uL (ref 0.0–0.1)
Immature Granulocytes: 1 %
Lymphocytes Absolute: 1.7 10*3/uL (ref 0.7–3.1)
Lymphs: 22 %
MCH: 29.5 pg (ref 26.6–33.0)
MCHC: 32.7 g/dL (ref 31.5–35.7)
MCV: 90 fL (ref 79–97)
Monocytes Absolute: 0.7 10*3/uL (ref 0.1–0.9)
Monocytes: 9 %
Neutrophils Absolute: 4.9 10*3/uL (ref 1.4–7.0)
Neutrophils: 65 %
Platelets: 346 10*3/uL (ref 150–450)
RBC: 4.98 x10E6/uL (ref 3.77–5.28)
RDW: 13.1 % (ref 11.7–15.4)
WBC: 7.5 10*3/uL (ref 3.4–10.8)

## 2019-11-19 LAB — LIPID PANEL WITH LDL/HDL RATIO
Cholesterol, Total: 167 mg/dL (ref 100–199)
HDL: 56 mg/dL (ref >39)
LDL Chol Calc (NIH): 96 mg/dL (ref 0–99)
LDL/HDL Ratio: 1.7 ratio (ref 0.0–3.2)
Triglycerides: 78 mg/dL (ref 0–149)
VLDL Cholesterol Cal: 15 mg/dL (ref 5–40)

## 2019-11-19 LAB — INSULIN, RANDOM: INSULIN: 16.5 u[IU]/mL (ref 2.6–24.9)

## 2019-11-19 LAB — TSH: TSH: 1.96 u[IU]/mL (ref 0.450–4.500)

## 2019-11-19 LAB — HEMOGLOBIN A1C
Est. average glucose Bld gHb Est-mCnc: 117 mg/dL
Hgb A1c MFr Bld: 5.7 % — ABNORMAL HIGH (ref 4.8–5.6)

## 2019-11-24 DIAGNOSIS — R05 Cough: Secondary | ICD-10-CM | POA: Diagnosis not present

## 2019-12-03 ENCOUNTER — Other Ambulatory Visit (INDEPENDENT_AMBULATORY_CARE_PROVIDER_SITE_OTHER): Payer: Self-pay | Admitting: Family Medicine

## 2019-12-03 DIAGNOSIS — I10 Essential (primary) hypertension: Secondary | ICD-10-CM

## 2019-12-08 ENCOUNTER — Telehealth: Payer: Self-pay | Admitting: Internal Medicine

## 2019-12-08 NOTE — Telephone Encounter (Signed)
She needs a negative Covid test before we can bring her into the office.

## 2019-12-08 NOTE — Telephone Encounter (Signed)
Jane Avila [phone]  Marilyne called to say she has had a cough and sore throat for 11 weeks, 2 weeks ago she went to urgent care. She is still having cough and sore throat. Would like to be seen on Thursday.

## 2019-12-08 NOTE — Telephone Encounter (Signed)
Called patient back she has not been tested for COVID-19, she will go and get tested and call back once she has negative results. She did receive Pfizer COVID-19 Vaccine December and January.

## 2019-12-10 ENCOUNTER — Ambulatory Visit (INDEPENDENT_AMBULATORY_CARE_PROVIDER_SITE_OTHER): Payer: BC Managed Care – PPO | Admitting: Family Medicine

## 2019-12-10 ENCOUNTER — Encounter (INDEPENDENT_AMBULATORY_CARE_PROVIDER_SITE_OTHER): Payer: Self-pay | Admitting: Family Medicine

## 2019-12-10 ENCOUNTER — Other Ambulatory Visit: Payer: Self-pay

## 2019-12-10 VITALS — BP 106/76 | HR 81 | Temp 98.3°F | Ht 64.0 in | Wt 171.0 lb

## 2019-12-10 DIAGNOSIS — R7303 Prediabetes: Secondary | ICD-10-CM | POA: Diagnosis not present

## 2019-12-10 DIAGNOSIS — G4709 Other insomnia: Secondary | ICD-10-CM

## 2019-12-10 DIAGNOSIS — Z8249 Family history of ischemic heart disease and other diseases of the circulatory system: Secondary | ICD-10-CM

## 2019-12-10 DIAGNOSIS — Z6830 Body mass index (BMI) 30.0-30.9, adult: Secondary | ICD-10-CM

## 2019-12-10 DIAGNOSIS — E669 Obesity, unspecified: Secondary | ICD-10-CM

## 2019-12-10 DIAGNOSIS — Z9189 Other specified personal risk factors, not elsewhere classified: Secondary | ICD-10-CM | POA: Diagnosis not present

## 2019-12-10 MED ORDER — TRAZODONE HCL 50 MG PO TABS: 50.0000 mg | ORAL_TABLET | Freq: Every evening | ORAL | 0 refills | Status: DC | PRN

## 2019-12-10 MED ORDER — WEGOVY 0.25 MG/0.5ML ~~LOC~~ SOAJ: 0.2500 mg | SUBCUTANEOUS | 0 refills | Status: DC

## 2019-12-10 MED ORDER — PHENTERMINE HCL 8 MG PO TABS: 1.0000 | ORAL_TABLET | Freq: Every day | ORAL | 0 refills | Status: DC

## 2019-12-15 NOTE — Progress Notes (Signed)
Chief Complaint:   OBESITY Jane Avila is here to discuss her progress with her obesity treatment plan along with follow-up of her obesity related diagnoses. Jane Avila is on the Category 2 Plan and states she is following her eating plan approximately 60% of the time. Jane Avila states she is walking for 20-25 minutes 2-3 times per week.  Today's visit was #: 38 Starting weight: 188 lbs Starting date: 04/05/2018 Today's weight: 171 lbs Today's date: 12/10/2019 Total lbs lost to date: 16 lbs Total lbs lost since last in-office visit: 1 lb Total weight loss percentage to date: -9.04%  Interim History: Happy with plan and weight loss. Tolerating medications without side effects. Sleeping well with Trazodone.   Assessment/Plan:   1. Other insomnia Well controlled.  No signs of complications, medication side effects, or red flags.  Continue current regimen.    - Refill traZODone (DESYREL) 50 MG tablet; Take 1 tablet (50 mg total) by mouth at bedtime as needed for sleep.  Dispense: 90 tablet; Refill: 0  2. Family history of cardiac disorder Family history is very strong.  Will order cardiac CT.  3. Prediabetes Not optimized. Goal is HgbA1c < 5.7 and insulin level closer to 5. She will continue to focus on protein-rich, low simple carbohydrate foods. We reviewed the importance of hydration, regular exercise for stress reduction, and restorative sleep.   Lab Results  Component Value Date   HGBA1C 5.7 (H) 11/18/2019   Lab Results  Component Value Date   INSULIN 16.5 11/18/2019   INSULIN 9.1 02/04/2019   4. At risk for heart disease Jane Avila was given approximately 15 minutes of coronary artery disease prevention counseling today. She is 49 y.o. female and has risk factors for heart disease including obesity and strong family history. We discussed intensive lifestyle modifications today with an emphasis on specific weight loss instructions and strategies. We discussed Cardiac CT Scores.  Analy is able to get free scans at GI. Will order.   5. Class 1 obesity with serious comorbidity and body mass index (BMI) of 30.0 to 30.9 in adult, unspecified obesity type  Patient understands that long-term use of phentermine is considered off-label use of this medication, however, that the Endocrine Society and recent research supports that long-term use of phentermine does not appear to have detrimental health effects when used in the appropriate patient. In addition, a 2019 study published in Obesity Journal on 13,972 patients concluded that "recommendations to limit phentermine to less than 3 months do not align with current concepts of pharmacologic treatment of obesity", and that "long term phentermine users experience greater weight loss without apparent increases in cardiovascular risk".  We reviewed potential side effects including insomnia, dry mouth, increased heart rate and blood pressure, increased anxiety. We reviewed reducing caffeine consumption while taking phentermine, especially if the patient is experiencing side effects. All questions were answered, and the patient wishes to move forward with this medication.  I have consulted the [place] Controlled Substances Registry for this patient, and feel the risk/benefit ratio today is favorable for proceeding with this prescription for a controlled substance. The patient understands monitoring parameters and red flags.   Orders - Phentermine HCl 8 MG TABS; Take 1 tablet by mouth daily.  Dispense: 30 tablet; Refill: 0 - Semaglutide-Weight Management (WEGOVY) 0.25 MG/0.5ML SOAJ; Inject 0.25 mg into the skin once a week.  Dispense: 2 mL; Refill: 0  Jane Avila is currently in the action stage of change. As such, her goal is to maintain weight  for now. She has agreed to the Category 2 Plan.   Exercise goals: For substantial health benefits, adults should do at least 150 minutes (2 hours and 30 minutes) a week of moderate-intensity, or 75  minutes (1 hour and 15 minutes) a week of vigorous-intensity aerobic physical activity, or an equivalent combination of moderate- and vigorous-intensity aerobic activity. Aerobic activity should be performed in episodes of at least 10 minutes, and preferably, it should be spread throughout the week.  Behavioral modification strategies: increasing lean protein intake, decreasing simple carbohydrates and increasing vegetables.  Jane Avila has agreed to follow-up with our clinic in 3 weeks. She was informed of the importance of frequent follow-up visits to maximize her success with intensive lifestyle modifications for her multiple health conditions.   Objective:   Blood pressure 106/76, pulse 81, temperature 98.3 F (36.8 C), temperature source Oral, height 5' 4"  (1.626 m), weight 171 lb (77.6 kg), SpO2 97 %. Body mass index is 29.35 kg/m.  General: Cooperative, alert, well developed, in no acute distress. HEENT: Conjunctivae and lids unremarkable. Cardiovascular: Regular rhythm.  Lungs: Normal work of breathing. Neurologic: No focal deficits.   Lab Results  Component Value Date   CREATININE 0.89 11/18/2019   BUN 12 11/18/2019   NA 137 11/18/2019   K 4.2 11/18/2019   CL 99 11/18/2019   CO2 24 11/18/2019   Lab Results  Component Value Date   ALT 19 11/18/2019   AST 19 11/18/2019   ALKPHOS 76 11/18/2019   BILITOT 0.6 11/18/2019   Lab Results  Component Value Date   HGBA1C 5.7 (H) 11/18/2019   HGBA1C 5.4 02/04/2019   Lab Results  Component Value Date   INSULIN 16.5 11/18/2019   INSULIN 9.1 02/04/2019   Lab Results  Component Value Date   TSH 1.960 11/18/2019   Lab Results  Component Value Date   CHOL 167 11/18/2019   HDL 56 11/18/2019   LDLCALC 96 11/18/2019   TRIG 78 11/18/2019   CHOLHDL 2.5 07/06/2014   Lab Results  Component Value Date   WBC 7.5 11/18/2019   HGB 14.7 11/18/2019   HCT 45.0 11/18/2019   MCV 90 11/18/2019   PLT 346 11/18/2019   Attestation  Statements:   Reviewed by clinician on day of visit: allergies, medications, problem list, medical history, surgical history, family history, social history, and previous encounter notes.  I, Water quality scientist, CMA, am acting as transcriptionist for Briscoe Deutscher, DO  I have reviewed the above documentation for accuracy and completeness, and I agree with the above. Briscoe Deutscher, DO

## 2019-12-16 ENCOUNTER — Encounter (INDEPENDENT_AMBULATORY_CARE_PROVIDER_SITE_OTHER): Payer: Self-pay | Admitting: Family Medicine

## 2019-12-16 NOTE — Telephone Encounter (Signed)
Patient called back and she has negative COVID-19 test from [facility]. She is off off work on Wednesday and Thursdays, so I scheduled her for an office visit for tomorrow.

## 2019-12-17 ENCOUNTER — Encounter: Payer: Self-pay | Admitting: Internal Medicine

## 2019-12-17 ENCOUNTER — Ambulatory Visit
Admission: RE | Admit: 2019-12-17 | Discharge: 2019-12-17 | Disposition: A | Discharge status: Home / Self Care | Payer: BLUE CROSS/BLUE SHIELD | Source: Ambulatory Visit | Attending: Internal Medicine | Admitting: Internal Medicine

## 2019-12-17 ENCOUNTER — Ambulatory Visit (INDEPENDENT_AMBULATORY_CARE_PROVIDER_SITE_OTHER): Payer: BC Managed Care – PPO | Admitting: Internal Medicine

## 2019-12-17 ENCOUNTER — Other Ambulatory Visit: Payer: Self-pay

## 2019-12-17 VITALS — BP 140/90 | HR 78 | Temp 97.9°F

## 2019-12-17 DIAGNOSIS — R05 Cough: Secondary | ICD-10-CM | POA: Diagnosis not present

## 2019-12-17 DIAGNOSIS — R053 Chronic cough: Secondary | ICD-10-CM

## 2019-12-17 DIAGNOSIS — I10 Essential (primary) hypertension: Secondary | ICD-10-CM | POA: Diagnosis not present

## 2019-12-17 DIAGNOSIS — E039 Hypothyroidism, unspecified: Secondary | ICD-10-CM | POA: Diagnosis not present

## 2019-12-17 DIAGNOSIS — R7302 Impaired glucose tolerance (oral): Secondary | ICD-10-CM | POA: Diagnosis not present

## 2019-12-17 MED ORDER — FLUTICASONE-SALMETEROL 100-50 MCG/DOSE IN AEPB: 1.0000 | INHALATION_SPRAY | Freq: Two times a day (BID) | RESPIRATORY_TRACT | 0 refills | Status: DC

## 2019-12-17 MED ORDER — ALBUTEROL SULFATE HFA 108 (90 BASE) MCG/ACT IN AERS: 2.0000 | INHALATION_SPRAY | Freq: Four times a day (QID) | RESPIRATORY_TRACT | 0 refills | Status: DC | PRN

## 2019-12-17 MED ORDER — HYDROCODONE-HOMATROPINE 5-1.5 MG/5ML PO SYRP: 5.0000 mL | ORAL_SOLUTION | Freq: Three times a day (TID) | ORAL | 0 refills | Status: DC | PRN

## 2019-12-17 MED ORDER — BUDESONIDE-FORMOTEROL FUMARATE 160-4.5 MCG/ACT IN AERO: 2.0000 | INHALATION_SPRAY | Freq: Two times a day (BID) | RESPIRATORY_TRACT | 3 refills | Status: DC

## 2019-12-17 MED ORDER — PREDNISONE 10 MG PO TABS: ORAL_TABLET | ORAL | 0 refills | Status: DC

## 2019-12-17 MED ORDER — LEVOFLOXACIN 500 MG PO TABS: 500.0000 mg | ORAL_TABLET | Freq: Every day | ORAL | 0 refills | Status: DC

## 2019-12-17 NOTE — Progress Notes (Signed)
   Subjective:    Patient ID: Jane Avila, female    DOB: [birth year], 49 y.o.   MRN: [id number]  HPI 49 year old Female seen today with cough present for some time.  She and her husband had Covid in January 2021.  In June they went to [facility].  She subsequently developed cough that was dry and nagging.  She has had no fever or chills.  She had a TB test placed at [facility] about a month ago where she has been working some.  She also works at [employer] and at [employer].  She currently is being seen at [facility].  Recent A1c was 5.7% in early September.  TSH was normal.  CBC and c-Met were normal.  She has tried Prilosec for possible reflux after being seen at an urgent care in early September but has not seen a difference in cough..  She had a negative COVID-19 test in June.  She takes trazodone, losartan, Wellbutrin and Synthroid.  She works as a Designer, jewellery in Building services engineer.  She has tried an inhaler which seemed to help.  Early on with the cough she had 3 days of prednisone.  She has received 2 COVID-19 immunizations in January 2021 and December 2020.  Review of Systems see above     Objective:   Physical Exam Temperature 97.9 degrees pulse 78 regular pulse oximetry 98% blood pressure 140/90  Skin warm and dry.  No cervical adenopathy.  TMs are clear.  Neck is supple.  Chest is clear to auscultation.  No thyromegaly.       Assessment & Plan:  Protracted cough since trip to [place] in June.  Has tried PPI without much relief.  Did get some relief with an inhaler and 3 days of prednisone.  Recommend chest x-ray at this point.  She will take a tapering course of prednisone starting with 6 tablets day 1 and decreasing by 1 tablet daily over 6 days i.e. 6-5-4-3-2-1 taper.  She will take Levaquin 500 mg daily for 10 days and Hycodan cough syrup 1 teaspoon p.o. every 8 hours as needed cough.  If not  improving after that we will consider referring to Pulmonary for evaluation of chronic cough.

## 2019-12-17 NOTE — Patient Instructions (Signed)
Take prednisone as directed in tapering course over 6 days starting with 6 tablets day 1 and decreasing by 10 mg daily.  Take Hycodan 1 teaspoon by mouth every 8 hours as needed for cough.  Not sure that PPI is helping at this point.  Take Levaquin 500 mg daily for 10 days.  Please have chest x-ray.  Call if not improved in 2 weeks.

## 2019-12-30 ENCOUNTER — Ambulatory Visit
Admission: RE | Admit: 2019-12-30 | Discharge: 2019-12-30 | Disposition: A | Discharge status: Home / Self Care | Payer: No Typology Code available for payment source | Source: Ambulatory Visit | Attending: Family Medicine | Admitting: Family Medicine

## 2019-12-30 DIAGNOSIS — Z8249 Family history of ischemic heart disease and other diseases of the circulatory system: Secondary | ICD-10-CM

## 2019-12-30 DIAGNOSIS — Z9189 Other specified personal risk factors, not elsewhere classified: Secondary | ICD-10-CM

## 2019-12-31 ENCOUNTER — Encounter (INDEPENDENT_AMBULATORY_CARE_PROVIDER_SITE_OTHER): Payer: Self-pay | Admitting: Family Medicine

## 2019-12-31 ENCOUNTER — Other Ambulatory Visit: Payer: Self-pay

## 2019-12-31 ENCOUNTER — Ambulatory Visit (INDEPENDENT_AMBULATORY_CARE_PROVIDER_SITE_OTHER): Payer: BC Managed Care – PPO | Admitting: Family Medicine

## 2019-12-31 VITALS — BP 135/83 | HR 92 | Temp 98.0°F | Ht 64.0 in | Wt 171.0 lb

## 2019-12-31 DIAGNOSIS — G4709 Other insomnia: Secondary | ICD-10-CM | POA: Diagnosis not present

## 2019-12-31 DIAGNOSIS — R632 Polyphagia: Secondary | ICD-10-CM

## 2019-12-31 DIAGNOSIS — Z6836 Body mass index (BMI) 36.0-36.9, adult: Secondary | ICD-10-CM

## 2019-12-31 DIAGNOSIS — R911 Solitary pulmonary nodule: Secondary | ICD-10-CM | POA: Diagnosis not present

## 2019-12-31 DIAGNOSIS — Z9189 Other specified personal risk factors, not elsewhere classified: Secondary | ICD-10-CM

## 2019-12-31 MED ORDER — TRAZODONE HCL 50 MG PO TABS: 50.0000 mg | ORAL_TABLET | Freq: Every evening | ORAL | 0 refills | Status: DC | PRN

## 2019-12-31 MED ORDER — PHENTERMINE HCL 8 MG PO TABS: 1.0000 | ORAL_TABLET | Freq: Every day | ORAL | 0 refills | Status: DC

## 2019-12-31 MED ORDER — BD PEN NEEDLE NANO 2ND GEN 32G X 4 MM MISC: 1.0000 | Freq: Every day | 0 refills | Status: DC

## 2019-12-31 MED ORDER — SAXENDA 18 MG/3ML ~~LOC~~ SOPN: 3.0000 mg | PEN_INJECTOR | Freq: Every day | SUBCUTANEOUS | 0 refills | Status: DC

## 2020-01-02 ENCOUNTER — Encounter (INDEPENDENT_AMBULATORY_CARE_PROVIDER_SITE_OTHER): Payer: Self-pay | Admitting: Family Medicine

## 2020-01-04 NOTE — Progress Notes (Signed)
Chief Complaint:   OBESITY [name] is here to discuss her progress with her obesity treatment plan along with follow-up of her obesity related diagnoses. [name] is on the Category 2 Plan and states she is following her eating plan approximately 40% of the time. [name] states she is exercising for 0 minutes 0 times per week.  Today's visit was #: 14 Starting weight: 188 lbs Starting date: 04/05/2018 Today's weight: 171 lbs Today's date: 12/31/2019 Total lbs lost to date: 17 lbs Total lbs lost since last in-office visit: 0 Total weight loss percentage to date: -9.04%  Interim History: [name] had a birthday and says she did more eating than usually.  Her son visited her from [place].    Plan:  Saxenda 3 mg subcutaneously daily (with needles).  Assessment/Plan:   1. Other insomnia Improved. We will continue to monitor symptoms as they relate to her weight loss journey.   - Refill traZODone (DESYREL) 50 MG tablet; Take 1 tablet (50 mg total) by mouth at bedtime as needed for sleep.  Dispense: 90 tablet; Refill: 0  2. Polyphagia Hyperphagia, also called polyphagia, refers to excessive feelings of hunger. She will continue to focus on protein-rich, low simple carbohydrate foods. We reviewed the importance of hydration, regular exercise for stress reduction, and restorative sleep.  - Refill Phentermine HCl 8 MG TABS; Take 1 tablet by mouth daily.  Dispense: 30 tablet; Refill: 0  Patient understands that long-term use of phentermine is considered off-label use of this medication, however, that the Endocrine Society and recent research supports that long-term use of phentermine does not appear to have detrimental health effects when used in the appropriate patient. In addition, a 2019 study published in Obesity Journal on [id number] patients concluded that "recommendations to limit phentermine to less than 3 months do not align with current concepts of pharmacologic treatment of obesity",  and that "long term phentermine users experience greater weight loss without apparent increases in cardiovascular risk".  The potential risks and benefits of phentermine have been reviewed with the patient, and alternative treatment options were discussed. All questions were answered, and the patient wishes to move forward with this medication.  I have consulted the [place] Controlled Substances Registry for this patient, and feel the risk/benefit ratio today is favorable for proceeding with this prescription for a controlled substance. The patient understands monitoring parameters and red flags.   3. Pulmonary nodule This was an incidental finding on Cardiac CT.  Will monitor.   4. At risk for nausea Carlton Sweaney was given approximately 15 minutes of nausea prevention counseling today. [name] is at risk for nausea due to starting Saxenda. She was encouraged to titrate her medication slowly, make sure to stay hydrated, eat smaller portions throughout the day, and avoid high fat meals.   5. Class 2 severe obesity with serious comorbidity and body mass index (BMI) of 36.0 to 36.9 in adult, unspecified obesity type [facility]) We have reviewed the risks and benefits of Saxenda. The patient denies a personal or family history of medullary thyroid cancer or MENII. The patient denies a history of pancreatitis.  Alternative treatment options have been discussed. Patient understands that all anti-obesity medications are contraindicated in pregnancy. The potential risks and benefits of Saxenda were reviewed with the patient, and alternative treatment options were discussed. All questions were answered, and the patient wishes to move forward with this medication.  Please visit [email or web address] for information about this medication. Please visit [email or web address] to review how to administer  Saxenda.   Start with escalation dose: ? Week 1: 0.6 mg [place] once daily x 1 week ? Week 2: 1.2 mg [place] once daily x 1  week ? Week 3: 1.8 mg [place] once daily x 1 week ? Week 4: 2.4 mg [place] once daily x 1 week ? Week 5 onward: 3 mg [place] once daily  - Start Liraglutide -Weight Management (SAXENDA) 18 MG/3ML SOPN; Inject 3 mg into the skin daily.  Dispense: 4.5 mL; Refill: 0 - Insulin Pen Needle (BD PEN NEEDLE NANO 2ND GEN) 32G X 4 MM MISC; 1 Package by Does not apply route daily.  Dispense: 100 each; Refill: 0  [name] is currently in the action stage of change. As such, her goal is to continue with weight loss efforts. She has agreed to the Category 2 Plan.   Exercise goals: For substantial health benefits, adults should do at least 150 minutes (2 hours and 30 minutes) a week of moderate-intensity, or 75 minutes (1 hour and 15 minutes) a week of vigorous-intensity aerobic physical activity, or an equivalent combination of moderate- and vigorous-intensity aerobic activity. Aerobic activity should be performed in episodes of at least 10 minutes, and preferably, it should be spread throughout the week.  Behavioral modification strategies: increasing lean protein intake, decreasing simple carbohydrates, increasing vegetables and increasing water intake.  [name] has agreed to follow-up with our clinic in 3 weeks. She was informed of the importance of frequent follow-up visits to maximize her success with intensive lifestyle modifications for her multiple health conditions.   Objective:   Blood pressure 135/83, pulse 92, temperature 98 F (36.7 C), height 5' 4"  (1.626 m), weight 171 lb (77.6 kg), SpO2 97 %. Body mass index is 29.35 kg/m.  General: Cooperative, alert, well developed, in no acute distress. HEENT: Conjunctivae and lids unremarkable. Cardiovascular: Regular rhythm.  Lungs: Normal work of breathing. Neurologic: No focal deficits.   Lab Results  Component Value Date   CREATININE 0.89 11/18/2019   BUN 12 11/18/2019   NA 137 11/18/2019   K 4.2 11/18/2019   CL 99 11/18/2019   CO2 24 11/18/2019    Lab Results  Component Value Date   ALT 19 11/18/2019   AST 19 11/18/2019   ALKPHOS 76 11/18/2019   BILITOT 0.6 11/18/2019   Lab Results  Component Value Date   HGBA1C 5.7 (H) 11/18/2019   HGBA1C 5.4 02/04/2019   Lab Results  Component Value Date   INSULIN 16.5 11/18/2019   INSULIN 9.1 02/04/2019   Lab Results  Component Value Date   TSH 1.960 11/18/2019   Lab Results  Component Value Date   CHOL 167 11/18/2019   HDL 56 11/18/2019   LDLCALC 96 11/18/2019   TRIG 78 11/18/2019   CHOLHDL 2.5 07/06/2014   Lab Results  Component Value Date   WBC 7.5 11/18/2019   HGB 14.7 11/18/2019   HCT 45.0 11/18/2019   MCV 90 11/18/2019   PLT 346 11/18/2019   Attestation Statements:   Reviewed by clinician on day of visit: allergies, medications, problem list, medical history, surgical history, family history, social history, and previous encounter notes.  I, Water quality scientist, CMA, am acting as transcriptionist for Briscoe Deutscher, DO  I have reviewed the above documentation for accuracy and completeness, and I agree with the above. Briscoe Deutscher, DO

## 2020-01-07 NOTE — Telephone Encounter (Signed)
PA started, UOH:[id number]

## 2020-01-13 ENCOUNTER — Other Ambulatory Visit: Payer: Self-pay | Admitting: Internal Medicine

## 2020-01-13 NOTE — Telephone Encounter (Signed)
Can you please call to se why patient needs refill?

## 2020-01-19 NOTE — Telephone Encounter (Signed)
Please advise 

## 2020-02-03 ENCOUNTER — Ambulatory Visit (INDEPENDENT_AMBULATORY_CARE_PROVIDER_SITE_OTHER): Payer: BC Managed Care – PPO | Admitting: Family Medicine

## 2020-02-25 ENCOUNTER — Encounter (INDEPENDENT_AMBULATORY_CARE_PROVIDER_SITE_OTHER): Payer: Self-pay | Admitting: Family Medicine

## 2020-02-25 ENCOUNTER — Other Ambulatory Visit: Payer: Self-pay

## 2020-02-25 ENCOUNTER — Ambulatory Visit (INDEPENDENT_AMBULATORY_CARE_PROVIDER_SITE_OTHER): Payer: BC Managed Care – PPO | Admitting: Family Medicine

## 2020-02-25 VITALS — BP 128/88 | HR 84 | Temp 98.1°F | Ht 64.0 in | Wt 165.0 lb

## 2020-02-25 DIAGNOSIS — Z9189 Other specified personal risk factors, not elsewhere classified: Secondary | ICD-10-CM | POA: Diagnosis not present

## 2020-02-25 DIAGNOSIS — Z6830 Body mass index (BMI) 30.0-30.9, adult: Secondary | ICD-10-CM

## 2020-02-25 DIAGNOSIS — F3289 Other specified depressive episodes: Secondary | ICD-10-CM

## 2020-02-25 DIAGNOSIS — G4709 Other insomnia: Secondary | ICD-10-CM | POA: Diagnosis not present

## 2020-02-25 DIAGNOSIS — I10 Essential (primary) hypertension: Secondary | ICD-10-CM | POA: Diagnosis not present

## 2020-02-25 DIAGNOSIS — E669 Obesity, unspecified: Secondary | ICD-10-CM | POA: Diagnosis not present

## 2020-02-25 MED ORDER — BUPROPION HCL ER (XL) 300 MG PO TB24: 300.0000 mg | ORAL_TABLET | Freq: Every day | ORAL | 0 refills | Status: DC

## 2020-02-25 MED ORDER — LOSARTAN POTASSIUM 25 MG PO TABS: 25.0000 mg | ORAL_TABLET | Freq: Every day | ORAL | 0 refills | Status: DC

## 2020-02-25 MED ORDER — TRAZODONE HCL 50 MG PO TABS: 50.0000 mg | ORAL_TABLET | Freq: Every evening | ORAL | 0 refills | Status: DC | PRN

## 2020-02-29 NOTE — Progress Notes (Signed)
Chief Complaint:   OBESITY Jane Avila is here to discuss her progress with her obesity treatment plan along with follow-up of her obesity related diagnoses.   Today's visit was #: 15 Starting weight: 188 lbs Starting date: 04/05/2018 Today's weight: 165 lbs Today's date: 02/25/2020 Total lbs lost to date: 23 lbs Body mass index is 28.32 kg/m.  Total weight loss percentage to date: -12.23%  Interim History: Jane Avila is on Saxenda 0.6 mg subcutaneously daily.  She says that 1.2 mg made her nauseous.  She is tolerating all other medications.  She says she is happy with her weight loss. Nutrition Plan: the Category 2 Plan for 60% of the time.  Anti-obesity medications: Saxenda 0.6 mg subcutaneously daily. Reported side effects: None at current dose. Hunger is well controlled. Cravings are well controlled.  Activity: Walking for 20 minutes 4 times per week.  Assessment/Plan:   1. Essential hypertension Controlled. Medications: HCTZ 25 mg daily and Cozaar 25 mg daily.   Plan: Avoid buying foods that are: processed, frozen, or prepackaged to avoid excess salt. We will continue to monitor symptoms as they relate to her weight loss journey.  BP Readings from Last 3 Encounters:  02/25/20 128/88  12/31/19 135/83  12/17/19 140/90   Lab Results  Component Value Date   CREATININE 0.89 11/18/2019   - Refill losartan (COZAAR) 25 MG tablet; Take 1 tablet (25 mg total) by mouth daily.  Dispense: 90 tablet; Refill: 0  2. Other insomnia Improved. Plan: Recommend sleep hygiene measures including regular sleep schedule, optimal sleep environment, and relaxing presleep rituals. Medication: trazodone 50 mg at bedtime as needed for sleep.  - Refill traZODone (DESYREL) 50 MG tablet; Take 1 tablet (50 mg total) by mouth at bedtime as needed for sleep.  Dispense: 90 tablet; Refill: 0  3. Other depression, with emotional eating Improved. Jane Avila is taking Wellbutrin XL 300 mg daily. Motivational  interviewing as well as evidence-based interventions for health behavior change were utilized today including the discussion of self monitoring techniques, problem-solving barriers and SMART goal setting techniques.   - Refill buPROPion (WELLBUTRIN XL) 300 MG 24 hr tablet; Take 1 tablet (300 mg total) by mouth daily.  Dispense: 90 tablet; Refill: 0  4. At risk for nausea Jane Avila was given approximately 9 minutes of nausea prevention counseling today. Jane Avila is at risk for nausea due to her new or current medication. She was encouraged to titrate her medication slowly, make sure to stay hydrated, eat smaller portions throughout the day, and avoid high fat meals.   5. Class 1 obesity with serious comorbidity and body mass index (BMI) of 30.0 to 30.9 in adult, unspecified obesity type  Course: Jane Avila is currently in the action stage of change. As such, her goal is to continue with weight loss efforts.   Nutrition goals: She has agreed to the Category 2 Plan.   Exercise goals: For substantial health benefits, adults should do at least 150 minutes (2 hours and 30 minutes) a week of moderate-intensity, or 75 minutes (1 hour and 15 minutes) a week of vigorous-intensity aerobic physical activity, or an equivalent combination of moderate- and vigorous-intensity aerobic activity. Aerobic activity should be performed in episodes of at least 10 minutes, and preferably, it should be spread throughout the week.  Behavioral modification strategies: increasing lean protein intake, decreasing simple carbohydrates, increasing vegetables, increasing water intake, dealing with family or coworker sabotage, travel eating strategies and holiday eating strategies .  [name] has agreed to  follow-up with our clinic in 4 weeks. She was informed of the importance of frequent follow-up visits to maximize her success with intensive lifestyle modifications for her multiple health conditions.   Objective:    Blood pressure 128/88, pulse 84, temperature 98.1 F (36.7 C), temperature source Oral, height 5' 4"  (1.626 m), weight 165 lb (74.8 kg), SpO2 97 %. Body mass index is 28.32 kg/m.  General: Cooperative, alert, well developed, in no acute distress. HEENT: Conjunctivae and lids unremarkable. Cardiovascular: Regular rhythm.  Lungs: Normal work of breathing. Neurologic: No focal deficits.   Lab Results  Component Value Date   CREATININE 0.89 11/18/2019   BUN 12 11/18/2019   NA 137 11/18/2019   K 4.2 11/18/2019   CL 99 11/18/2019   CO2 24 11/18/2019   Lab Results  Component Value Date   ALT 19 11/18/2019   AST 19 11/18/2019   ALKPHOS 76 11/18/2019   BILITOT 0.6 11/18/2019   Lab Results  Component Value Date   HGBA1C 5.7 (H) 11/18/2019   HGBA1C 5.4 02/04/2019   Lab Results  Component Value Date   INSULIN 16.5 11/18/2019   INSULIN 9.1 02/04/2019   Lab Results  Component Value Date   TSH 1.960 11/18/2019   Lab Results  Component Value Date   CHOL 167 11/18/2019   HDL 56 11/18/2019   LDLCALC 96 11/18/2019   TRIG 78 11/18/2019   CHOLHDL 2.5 07/06/2014   Lab Results  Component Value Date   WBC 7.5 11/18/2019   HGB 14.7 11/18/2019   HCT 45.0 11/18/2019   MCV 90 11/18/2019   PLT 346 11/18/2019   Attestation Statements:   Reviewed by clinician on day of visit: allergies, medications, problem list, medical history, surgical history, family history, social history, and previous encounter notes.  I, Water quality scientist, CMA, am acting as transcriptionist for Briscoe Deutscher, DO  I have reviewed the above documentation for accuracy and completeness, and I agree with the above. Briscoe Deutscher, DO

## 2020-03-20 DIAGNOSIS — Z20822 Contact with and (suspected) exposure to covid-19: Secondary | ICD-10-CM | POA: Diagnosis not present

## 2020-03-30 ENCOUNTER — Ambulatory Visit (INDEPENDENT_AMBULATORY_CARE_PROVIDER_SITE_OTHER): Payer: BC Managed Care – PPO | Admitting: Family Medicine

## 2020-03-30 ENCOUNTER — Other Ambulatory Visit: Payer: Self-pay

## 2020-03-30 ENCOUNTER — Encounter (INDEPENDENT_AMBULATORY_CARE_PROVIDER_SITE_OTHER): Payer: Self-pay | Admitting: Family Medicine

## 2020-03-30 VITALS — BP 130/89 | HR 81 | Temp 98.1°F | Ht 64.0 in | Wt 166.0 lb

## 2020-03-30 DIAGNOSIS — E038 Other specified hypothyroidism: Secondary | ICD-10-CM

## 2020-03-30 DIAGNOSIS — Z6830 Body mass index (BMI) 30.0-30.9, adult: Secondary | ICD-10-CM

## 2020-03-30 DIAGNOSIS — F3289 Other specified depressive episodes: Secondary | ICD-10-CM | POA: Diagnosis not present

## 2020-03-30 DIAGNOSIS — E669 Obesity, unspecified: Secondary | ICD-10-CM

## 2020-03-30 DIAGNOSIS — I10 Essential (primary) hypertension: Secondary | ICD-10-CM | POA: Diagnosis not present

## 2020-04-04 NOTE — Progress Notes (Signed)
Chief Complaint:   OBESITY Jane Avila is here to discuss her progress with her obesity treatment plan along with follow-up of her obesity related diagnoses.   Today's visit was #: 16 Starting weight: 188 lbs Starting date: 04/05/2018 Today's weight: 166 lbs Today's date: 03/30/2020 Total lbs lost to date: 22 lbs Body mass index is 28.49 kg/m.  Total weight loss percentage to date: -11.70%  Interim History: Jane Avila says she overindulged when she was on vacation.  She went to the [facility].  She reports that she is sometimes holding her HCTZ.  Her mom is in [place] for 3 weeks with the kids.  Nutrition Plan: the Category 2 Plan for 50% of the time.  Anti-obesity medications: Saxenda 3 mg subcutaneously daily and phentermine 8 mg daily. Reported side effects: None. Activity: Jane Avila says she has been doing more walking.  Assessment/Plan:   1. Other specified hypothyroidism Course: Stable. Medication: levothyroxine 100 mcg daily.   Lab Results  Component Value Date   TSH 1.960 11/18/2019   Plan: Patient was instructed not to take MVM or iron within 4 hours of taking thyroid medications. We will continue to monitor symptoms as they relate to her weight loss journey.  2. Essential hypertension At goal. Medications: HCTZ 25 mg daily, losartan 25 mg daily.   Plan: Avoid buying foods that are: processed, frozen, or prepackaged to avoid excess salt. We will continue to monitor symptoms as they relate to her weight loss journey.  BP Readings from Last 3 Encounters:  03/30/20 130/89  02/25/20 128/88  12/31/19 135/83   Lab Results  Component Value Date   CREATININE 0.89 11/18/2019   3. Other depression, with emotional eating Jane Avila is taking Wellbutrin XL 300 mg daily.  Behavior modification techniques were discussed today to help Jane Avila deal with her emotional/non-hunger eating behaviors.    4. Class 1 obesity with serious comorbidity and body mass index (BMI) of 30.0  to 30.9 in adult, unspecified obesity type  Course: Jane Avila is currently in the action stage of change. As such, her goal is to continue with weight loss efforts.   Nutrition goals: She has agreed to the Category 2 Plan.   Exercise goals: For substantial health benefits, adults should do at least 150 minutes (2 hours and 30 minutes) a week of moderate-intensity, or 75 minutes (1 hour and 15 minutes) a week of vigorous-intensity aerobic physical activity, or an equivalent combination of moderate- and vigorous-intensity aerobic activity. Aerobic activity should be performed in episodes of at least 10 minutes, and preferably, it should be spread throughout the week.  Behavioral modification strategies: increasing lean protein intake, decreasing simple carbohydrates, increasing vegetables and increasing water intake.  Jane Avila has agreed to follow-up with our clinic in 3-4 weeks. She was informed of the importance of frequent follow-up visits to maximize her success with intensive lifestyle modifications for her multiple health conditions.   Objective:   Blood pressure 130/89, pulse 81, temperature 98.1 F (36.7 C), temperature source Oral, height 5' 4"  (1.626 m), weight 166 lb (75.3 kg), SpO2 98 %. Body mass index is 28.49 kg/m.  General: Cooperative, alert, well developed, in no acute distress. HEENT: Conjunctivae and lids unremarkable. Cardiovascular: Regular rhythm.  Lungs: Normal work of breathing. Neurologic: No focal deficits.   Lab Results  Component Value Date   CREATININE 0.89 11/18/2019   BUN 12 11/18/2019   NA 137 11/18/2019   K 4.2 11/18/2019   CL 99 11/18/2019   CO2 24 11/18/2019  Lab Results  Component Value Date   ALT 19 11/18/2019   AST 19 11/18/2019   ALKPHOS 76 11/18/2019   BILITOT 0.6 11/18/2019   Lab Results  Component Value Date   HGBA1C 5.7 (H) 11/18/2019   HGBA1C 5.4 02/04/2019   Lab Results  Component Value Date   INSULIN 16.5 11/18/2019    INSULIN 9.1 02/04/2019   Lab Results  Component Value Date   TSH 1.960 11/18/2019   Lab Results  Component Value Date   CHOL 167 11/18/2019   HDL 56 11/18/2019   LDLCALC 96 11/18/2019   TRIG 78 11/18/2019   CHOLHDL 2.5 07/06/2014   Lab Results  Component Value Date   WBC 7.5 11/18/2019   HGB 14.7 11/18/2019   HCT 45.0 11/18/2019   MCV 90 11/18/2019   PLT 346 11/18/2019   Attestation Statements:   Reviewed by clinician on day of visit: allergies, medications, problem list, medical history, surgical history, family history, social history, and previous encounter notes.  Time spent on visit including pre-visit chart review and post-visit care and charting was 30 minutes.   I, Water quality scientist, CMA, am acting as transcriptionist for Briscoe Deutscher, DO  I have reviewed the above documentation for accuracy and completeness, and I agree with the above. Briscoe Deutscher, DO

## 2020-04-12 ENCOUNTER — Other Ambulatory Visit (INDEPENDENT_AMBULATORY_CARE_PROVIDER_SITE_OTHER): Payer: Self-pay | Admitting: Family Medicine

## 2020-04-13 NOTE — Telephone Encounter (Signed)
Last OV with Dr Juleen China

## 2020-04-19 ENCOUNTER — Other Ambulatory Visit (INDEPENDENT_AMBULATORY_CARE_PROVIDER_SITE_OTHER): Payer: Self-pay | Admitting: Family Medicine

## 2020-04-19 DIAGNOSIS — R632 Polyphagia: Secondary | ICD-10-CM

## 2020-04-19 NOTE — Telephone Encounter (Signed)
Last OV with Dr Juleen China

## 2020-04-20 MED ORDER — PHENTERMINE HCL 8 MG PO TABS: 1.0000 | ORAL_TABLET | Freq: Every day | ORAL | 0 refills | Status: DC

## 2020-04-27 ENCOUNTER — Ambulatory Visit (INDEPENDENT_AMBULATORY_CARE_PROVIDER_SITE_OTHER): Payer: BC Managed Care – PPO | Admitting: Family Medicine

## 2020-04-27 ENCOUNTER — Other Ambulatory Visit: Payer: Self-pay

## 2020-04-27 ENCOUNTER — Encounter (INDEPENDENT_AMBULATORY_CARE_PROVIDER_SITE_OTHER): Payer: Self-pay | Admitting: Family Medicine

## 2020-04-27 VITALS — BP 112/79 | HR 78 | Temp 97.9°F | Ht 64.0 in | Wt 165.0 lb

## 2020-04-27 DIAGNOSIS — Z6830 Body mass index (BMI) 30.0-30.9, adult: Secondary | ICD-10-CM

## 2020-04-27 DIAGNOSIS — I10 Essential (primary) hypertension: Secondary | ICD-10-CM | POA: Diagnosis not present

## 2020-04-27 DIAGNOSIS — R632 Polyphagia: Secondary | ICD-10-CM | POA: Diagnosis not present

## 2020-04-27 DIAGNOSIS — Z9189 Other specified personal risk factors, not elsewhere classified: Secondary | ICD-10-CM | POA: Diagnosis not present

## 2020-04-27 DIAGNOSIS — E669 Obesity, unspecified: Secondary | ICD-10-CM

## 2020-04-27 DIAGNOSIS — G4709 Other insomnia: Secondary | ICD-10-CM | POA: Diagnosis not present

## 2020-04-27 DIAGNOSIS — F3289 Other specified depressive episodes: Secondary | ICD-10-CM

## 2020-04-27 DIAGNOSIS — E038 Other specified hypothyroidism: Secondary | ICD-10-CM | POA: Diagnosis not present

## 2020-04-27 MED ORDER — BUPROPION HCL ER (XL) 300 MG PO TB24: 300.0000 mg | ORAL_TABLET | Freq: Every day | ORAL | 0 refills | Status: DC

## 2020-04-27 MED ORDER — PHENTERMINE HCL 8 MG PO TABS: 1.0000 | ORAL_TABLET | Freq: Every day | ORAL | 0 refills | Status: DC

## 2020-04-27 MED ORDER — TRAZODONE HCL 50 MG PO TABS: 50.0000 mg | ORAL_TABLET | Freq: Every evening | ORAL | 0 refills | Status: DC | PRN

## 2020-05-02 NOTE — Progress Notes (Signed)
Chief Complaint:   OBESITY Jane Avila is here to discuss her progress with her obesity treatment plan along with follow-up of her obesity related diagnoses.   Today's visit was #: 54 Starting weight: 188 lbs Starting date: 04/05/2018 Today's weight: 165 lbs Today's date: 04/27/2020 Total lbs lost to date: 23 lbs Body mass index is 28.32 kg/m.  Total weight loss percentage to date: -12.23%  Interim History: Jane Avila says she started Weight [employer].  She has not been drinking enough water.  She endorses boredom eating. Nutrition Plan: Category 2 Plan for 80% of the time. Anti-obesity medications: phentermine and Saxenda. Reported side effects: None. Activity: None at this time.  Assessment/Plan:   1. Other insomnia This is well controlled. Current treatment: trazodone 50 mg as needed at bedtime.  Plan: Recommend sleep hygiene measures including regular sleep schedule, optimal sleep environment, and relaxing presleep rituals.  We will continue to monitor this patient's condition along with her PCP.   - Refill traZODone (DESYREL) 50 MG tablet; Take 1 tablet (50 mg total) by mouth at bedtime as needed for sleep.  Dispense: 90 tablet; Refill: 0  2. Essential hypertension At goal. Medications: HCTZ 25 mg daily, Cozaar 25 mg daily.   Plan: Avoid buying foods that are: processed, frozen, or prepackaged to avoid excess salt. We will watch for signs of hypotension as she continues lifestyle modifications. We will continue to monitor closely alongside her PCP and/or Specialist.  Regular follow up with PCP and specialists was also encouraged.   BP Readings from Last 3 Encounters:  04/27/20 112/79  03/30/20 130/89  02/25/20 128/88   Lab Results  Component Value Date   CREATININE 0.89 11/18/2019   3. Polyphagia Current treatment: Saxenda 3 mg subcutaneously daily, phentermine 8 mg daily. Polyphagia refers to excessive feelings of hunger.  Plan:  Will refill phentermine, as per  below.  She will continue to focus on protein-rich, low simple carbohydrate foods. We reviewed the importance of hydration, regular exercise for stress reduction, and restorative sleep.  - Refill Phentermine HCl 8 MG TABS; Take 1 tablet by mouth daily.  Dispense: 30 tablet; Refill: 0  Having again reminded the patient of the "off label" use of Phentermine beyond three consecutive months, and again discussing the risks, benefits, contraindications, and limitations of it's use; given it's role in the successful treatment of obesity thus far and lack of adverse effect, patient has expressed desire and given informed verbal consent to continue use.   I have consulted the [place] Controlled Substances Registry for this patient, and feel the risk/benefit ratio today is favorable for proceeding with this prescription for a controlled substance. The patient understands monitoring parameters and red flags.   4. Other specified hypothyroidism Course: Stable. Medication: levothyroxine 100 mcg daily.   Plan: Patient was instructed not to take MVM or iron within 4 hours of taking thyroid medications. We will continue to monitor alongside Endocrinology/PCP as it relates to her weight loss journey.   Lab Results  Component Value Date   TSH 1.960 11/18/2019   5. Other depression, with emotional eating Jane Avila says she has been doing some boredom eating.  Medication: Wellbutrin XL 300 mg daily.   Plan:  Will refill Wellbutrin today, as per below.  Behavior modification techniques were discussed today to help deal with emotional/non-hunger eating behaviors.  - Refill buPROPion (WELLBUTRIN XL) 300 MG 24 hr tablet; Take 1 tablet (300 mg total) by mouth daily.  Dispense: 90 tablet; Refill: 0  6.  At risk for deficient intake of food Jane Avila was given extensive education and counseling today of more than 8 minutes on risks associated with deficient food intake.  Counseled her on the importance of following our  prescribed meal plan and eating adequate amounts of protein.  Discussed with Jane Avila that inadequate food intake over longer periods of time can slow their metabolism down significantly.   7. Class 1 obesity with serious comorbidity and body mass index (BMI) of 30.0 to 30.9 in adult, unspecified obesity type  Course: Jane Avila is currently in the action stage of change. As such, her goal is to continue with weight loss efforts.   Nutrition goals: She has agreed to keeping a food journal and adhering to recommended goals of 1200 calories and 95 grams of protein.   Exercise goals: For substantial health benefits, adults should do at least 150 minutes (2 hours and 30 minutes) a week of moderate-intensity, or 75 minutes (1 hour and 15 minutes) a week of vigorous-intensity aerobic physical activity, or an equivalent combination of moderate- and vigorous-intensity aerobic activity. Aerobic activity should be performed in episodes of at least 10 minutes, and preferably, it should be spread throughout the week.  Behavioral modification strategies: increasing lean protein intake, decreasing simple carbohydrates, increasing vegetables, increasing water intake and keeping a strict food journal.  Jane Avila has agreed to follow-up with our clinic in 4 weeks. She was informed of the importance of frequent follow-up visits to maximize her success with intensive lifestyle modifications for her multiple health conditions.   Objective:   Blood pressure 112/79, pulse 78, temperature 97.9 F (36.6 C), temperature source Oral, height 5' 4"  (1.626 m), weight 165 lb (74.8 kg), SpO2 98 %. Body mass index is 28.32 kg/m.  General: Cooperative, alert, well developed, in no acute distress. HEENT: Conjunctivae and lids unremarkable. Cardiovascular: Regular rhythm.  Lungs: Normal work of breathing. Neurologic: No focal deficits.   Lab Results  Component Value Date   CREATININE 0.89 11/18/2019   BUN 12  11/18/2019   NA 137 11/18/2019   K 4.2 11/18/2019   CL 99 11/18/2019   CO2 24 11/18/2019   Lab Results  Component Value Date   ALT 19 11/18/2019   AST 19 11/18/2019   ALKPHOS 76 11/18/2019   BILITOT 0.6 11/18/2019   Lab Results  Component Value Date   HGBA1C 5.7 (H) 11/18/2019   HGBA1C 5.4 02/04/2019   Lab Results  Component Value Date   INSULIN 16.5 11/18/2019   INSULIN 9.1 02/04/2019   Lab Results  Component Value Date   TSH 1.960 11/18/2019   Lab Results  Component Value Date   CHOL 167 11/18/2019   HDL 56 11/18/2019   LDLCALC 96 11/18/2019   TRIG 78 11/18/2019   CHOLHDL 2.5 07/06/2014   Lab Results  Component Value Date   WBC 7.5 11/18/2019   HGB 14.7 11/18/2019   HCT 45.0 11/18/2019   MCV 90 11/18/2019   PLT 346 11/18/2019   Attestation Statements:   Reviewed by clinician on day of visit: allergies, medications, problem list, medical history, surgical history, family history, social history, and previous encounter notes.  I, Water quality scientist, CMA, am acting as transcriptionist for Briscoe Deutscher, DO  I have reviewed the above documentation for accuracy and completeness, and I agree with the above. Briscoe Deutscher, DO

## 2020-05-12 DIAGNOSIS — N644 Mastodynia: Secondary | ICD-10-CM | POA: Diagnosis not present

## 2020-05-13 ENCOUNTER — Other Ambulatory Visit: Payer: Self-pay | Admitting: Internal Medicine

## 2020-05-13 DIAGNOSIS — N644 Mastodynia: Secondary | ICD-10-CM

## 2020-05-18 ENCOUNTER — Other Ambulatory Visit: Payer: Self-pay

## 2020-05-18 ENCOUNTER — Ambulatory Visit
Admission: RE | Admit: 2020-05-18 | Discharge: 2020-05-18 | Disposition: A | Discharge status: Home / Self Care | Payer: BC Managed Care – PPO | Source: Ambulatory Visit | Attending: Internal Medicine | Admitting: Internal Medicine

## 2020-05-18 DIAGNOSIS — E063 Autoimmune thyroiditis: Secondary | ICD-10-CM | POA: Diagnosis not present

## 2020-05-18 DIAGNOSIS — E038 Other specified hypothyroidism: Secondary | ICD-10-CM | POA: Diagnosis not present

## 2020-05-18 DIAGNOSIS — N644 Mastodynia: Secondary | ICD-10-CM

## 2020-06-01 ENCOUNTER — Ambulatory Visit (INDEPENDENT_AMBULATORY_CARE_PROVIDER_SITE_OTHER): Payer: BC Managed Care – PPO | Admitting: Family Medicine

## 2020-06-02 ENCOUNTER — Ambulatory Visit (INDEPENDENT_AMBULATORY_CARE_PROVIDER_SITE_OTHER): Payer: BC Managed Care – PPO | Admitting: Family Medicine

## 2020-06-02 ENCOUNTER — Encounter (INDEPENDENT_AMBULATORY_CARE_PROVIDER_SITE_OTHER): Payer: Self-pay | Admitting: Family Medicine

## 2020-06-02 ENCOUNTER — Other Ambulatory Visit: Payer: Self-pay

## 2020-06-02 VITALS — BP 118/77 | HR 78 | Temp 98.5°F | Ht 64.0 in | Wt 166.0 lb

## 2020-06-02 DIAGNOSIS — R632 Polyphagia: Secondary | ICD-10-CM

## 2020-06-02 DIAGNOSIS — Z9189 Other specified personal risk factors, not elsewhere classified: Secondary | ICD-10-CM

## 2020-06-02 DIAGNOSIS — G4709 Other insomnia: Secondary | ICD-10-CM | POA: Diagnosis not present

## 2020-06-02 DIAGNOSIS — F3289 Other specified depressive episodes: Secondary | ICD-10-CM

## 2020-06-02 DIAGNOSIS — Z6830 Body mass index (BMI) 30.0-30.9, adult: Secondary | ICD-10-CM

## 2020-06-02 DIAGNOSIS — I10 Essential (primary) hypertension: Secondary | ICD-10-CM

## 2020-06-02 DIAGNOSIS — E669 Obesity, unspecified: Secondary | ICD-10-CM

## 2020-06-02 MED ORDER — PHENTERMINE HCL 8 MG PO TABS: 1.0000 | ORAL_TABLET | Freq: Every day | ORAL | 0 refills | Status: DC

## 2020-06-02 MED ORDER — TRAZODONE HCL 50 MG PO TABS: 50.0000 mg | ORAL_TABLET | Freq: Every evening | ORAL | 0 refills | Status: DC | PRN

## 2020-06-02 MED ORDER — BUPROPION HCL ER (XL) 300 MG PO TB24: 300.0000 mg | ORAL_TABLET | Freq: Every day | ORAL | 0 refills | Status: DC

## 2020-06-02 MED ORDER — WEGOVY 0.25 MG/0.5ML ~~LOC~~ SOAJ: 0.2500 mg | SUBCUTANEOUS | 0 refills | Status: DC

## 2020-06-02 NOTE — Progress Notes (Signed)
Chief Complaint:   OBESITY Jane Avila is here to discuss her progress with her obesity treatment plan along with follow-up of her obesity related diagnoses.   Today's visit was #: 18 Starting weight: 188 lbs Starting date: 04/05/2018 Today's weight: 166 lbs Today's date: 06/02/2020 Total lbs lost to date: 22 lbs Body mass index is 28.49 kg/m.  Total weight loss percentage to date: -11.70%  Interim History:  [name] stopped Saxenda 1.4 mg due to vomiting each afternoon/evening. Plan:  Start Wegovy 0.25 mg subcutaneously weekly.  Current Meal Plan: the Category 2 Plan for 10% of the time.  Current Exercise Plan: None. Current Anti-Obesity Medications: phentermine 8 mg daily. Side effects: None.  Assessment/Plan:   1. Essential hypertension Not at goal. Medications: HCTZ 25 mg daily.   Plan: Avoid buying foods that are: processed, frozen, or prepackaged to avoid excess salt. We will watch for signs of hypotension as she continues lifestyle modifications. We will continue to monitor closely alongside her PCP and/or Specialist.  Regular follow up with PCP and specialists was also encouraged.   BP Readings from Last 3 Encounters:  06/02/20 118/77  04/27/20 112/79  03/30/20 130/89   Lab Results  Component Value Date   CREATININE 0.89 11/18/2019   2. Polyphagia Current treatment: phentermine 8 mg daily. Polyphagia refers to excessive feelings of hunger. She will continue to focus on protein-rich, low simple carbohydrate foods. We reviewed the importance of hydration, regular exercise for stress reduction, and restorative sleep.  - Refill Phentermine HCl 8 MG TABS; Take 1 tablet by mouth daily.  Dispense: 30 tablet; Refill: 0  Having again reminded the patient of the "off label" use of Phentermine beyond three consecutive months, and again discussing the risks, benefits, contraindications, and limitations of it's use; given it's role in the successful treatment of obesity thus  far and lack of adverse effect, patient has expressed desire and given informed verbal consent to continue use.   I have consulted the [place] Controlled Substances Registry for this patient, and feel the risk/benefit ratio today is favorable for proceeding with this prescription for a controlled substance. The patient understands monitoring parameters and red flags.   3. Other insomnia This is moderately controlled.  Current treatment: trazodone 50 mg daily.  Plan: Recommend sleep hygiene measures including regular sleep schedule, optimal sleep environment, and relaxing presleep rituals.   - Refill traZODone (DESYREL) 50 MG tablet; Take 1 tablet (50 mg total) by mouth at bedtime as needed for sleep.  Dispense: 90 tablet; Refill: 0  4. Other depression, with emotional eating Controlled. Medication: Wellbutrin XL 300 mg daily.  Plan:  Behavior modification techniques were discussed today to help deal with emotional/non-hunger eating behaviors.  - Refill buPROPion (WELLBUTRIN XL) 300 MG 24 hr tablet; Take 1 tablet (300 mg total) by mouth daily.  Dispense: 90 tablet; Refill: 0  5. At risk for nausea Equilla is at risk for nausea due to starting [facility].  Haylynn Pha Prine was given approximately 11 minutes of nausea prevention counseling today. Viridiana is at risk for nausea due to her new or current medication. She was encouraged to titrate her medication slowly, make sure to stay hydrated, eat smaller portions throughout the day, and avoid high fat meals.   6. Class 1 obesity with serious comorbidity and body mass index (BMI) of 30.0 to 30.9 in adult, unspecified obesity type  - Start Semaglutide-Weight Management (WEGOVY) 0.25 MG/0.5ML SOAJ; Inject 0.25 mg into the skin once a week.  Dispense: 2 mL; Refill: 0  Course: [name] is currently in the action stage of change. As such, her goal is to continue with weight loss efforts.   Nutrition goals: She has agreed to the Category 2 Plan.    Exercise goals: For substantial health benefits, adults should do at least 150 minutes (2 hours and 30 minutes) a week of moderate-intensity, or 75 minutes (1 hour and 15 minutes) a week of vigorous-intensity aerobic physical activity, or an equivalent combination of moderate- and vigorous-intensity aerobic activity. Aerobic activity should be performed in episodes of at least 10 minutes, and preferably, it should be spread throughout the week.  Behavioral modification strategies: increasing lean protein intake, decreasing simple carbohydrates, increasing vegetables and increasing water intake.  [name] has agreed to follow-up with our clinic in 6 weeks. She was informed of the importance of frequent follow-up visits to maximize her success with intensive lifestyle modifications for her multiple health conditions.   Objective:   Blood pressure 118/77, pulse 78, temperature 98.5 F (36.9 C), temperature source Oral, height 5' 4"  (1.626 m), weight 166 lb (75.3 kg), SpO2 97 %. Body mass index is 28.49 kg/m.  General: Cooperative, alert, well developed, in no acute distress. HEENT: Conjunctivae and lids unremarkable. Cardiovascular: Regular rhythm.  Lungs: Normal work of breathing. Neurologic: No focal deficits.   Lab Results  Component Value Date   CREATININE 0.89 11/18/2019   BUN 12 11/18/2019   NA 137 11/18/2019   K 4.2 11/18/2019   CL 99 11/18/2019   CO2 24 11/18/2019   Lab Results  Component Value Date   ALT 19 11/18/2019   AST 19 11/18/2019   ALKPHOS 76 11/18/2019   BILITOT 0.6 11/18/2019   Lab Results  Component Value Date   HGBA1C 5.7 (H) 11/18/2019   HGBA1C 5.4 02/04/2019   Lab Results  Component Value Date   INSULIN 16.5 11/18/2019   INSULIN 9.1 02/04/2019   Lab Results  Component Value Date   TSH 1.960 11/18/2019   Lab Results  Component Value Date   CHOL 167 11/18/2019   HDL 56 11/18/2019   LDLCALC 96 11/18/2019   TRIG 78 11/18/2019   CHOLHDL 2.5  07/06/2014   Lab Results  Component Value Date   WBC 7.5 11/18/2019   HGB 14.7 11/18/2019   HCT 45.0 11/18/2019   MCV 90 11/18/2019   PLT 346 11/18/2019   Attestation Statements:   Reviewed by clinician on day of visit: allergies, medications, problem list, medical history, surgical history, family history, social history, and previous encounter notes.  I, Water quality scientist, CMA, am acting as transcriptionist for Briscoe Deutscher, DO  I have reviewed the above documentation for accuracy and completeness, and I agree with the above. Briscoe Deutscher, DO

## 2020-06-09 DIAGNOSIS — E063 Autoimmune thyroiditis: Secondary | ICD-10-CM | POA: Diagnosis not present

## 2020-06-09 DIAGNOSIS — E038 Other specified hypothyroidism: Secondary | ICD-10-CM | POA: Diagnosis not present

## 2020-06-16 DIAGNOSIS — L821 Other seborrheic keratosis: Secondary | ICD-10-CM | POA: Diagnosis not present

## 2020-06-16 DIAGNOSIS — D2271 Melanocytic nevi of right lower limb, including hip: Secondary | ICD-10-CM | POA: Diagnosis not present

## 2020-06-16 DIAGNOSIS — L858 Other specified epidermal thickening: Secondary | ICD-10-CM | POA: Diagnosis not present

## 2020-06-16 DIAGNOSIS — D1801 Hemangioma of skin and subcutaneous tissue: Secondary | ICD-10-CM | POA: Diagnosis not present

## 2020-07-13 ENCOUNTER — Other Ambulatory Visit: Payer: Self-pay

## 2020-07-13 ENCOUNTER — Telehealth (INDEPENDENT_AMBULATORY_CARE_PROVIDER_SITE_OTHER): Payer: BC Managed Care – PPO | Admitting: Family Medicine

## 2020-07-13 ENCOUNTER — Encounter (INDEPENDENT_AMBULATORY_CARE_PROVIDER_SITE_OTHER): Payer: Self-pay | Admitting: Family Medicine

## 2020-07-13 DIAGNOSIS — Z6832 Body mass index (BMI) 32.0-32.9, adult: Secondary | ICD-10-CM

## 2020-07-13 DIAGNOSIS — E669 Obesity, unspecified: Secondary | ICD-10-CM

## 2020-07-13 DIAGNOSIS — R7303 Prediabetes: Secondary | ICD-10-CM | POA: Diagnosis not present

## 2020-07-13 DIAGNOSIS — G4709 Other insomnia: Secondary | ICD-10-CM | POA: Diagnosis not present

## 2020-07-13 DIAGNOSIS — R632 Polyphagia: Secondary | ICD-10-CM

## 2020-07-13 DIAGNOSIS — E039 Hypothyroidism, unspecified: Secondary | ICD-10-CM | POA: Diagnosis not present

## 2020-07-13 MED ORDER — PHENTERMINE HCL 8 MG PO TABS: 1.0000 | ORAL_TABLET | Freq: Every day | ORAL | 0 refills | Status: DC

## 2020-07-13 MED ORDER — TRAZODONE HCL 50 MG PO TABS: 50.0000 mg | ORAL_TABLET | Freq: Every evening | ORAL | 0 refills | Status: DC | PRN

## 2020-07-13 NOTE — Progress Notes (Signed)
TeleHealth Visit:  Due to the COVID-19 pandemic, this visit was completed with telemedicine (audio/video) technology to reduce patient and provider exposure as well as to preserve personal protective equipment.   Jane Avila has verbally consented to this TeleHealth visit. The patient is located at home, the provider is located at the [facility] office. The participants in this visit include the listed provider and patient. The visit was conducted today via MyChart video.  OBESITY Jane Avila is here to discuss her progress with her obesity treatment plan along with follow-up of her obesity related diagnoses.   Chief Complaint  Patient presents with  . Follow-up    Following Category 2 50% of the time. Walking 3-4 times per week for 20-30 minutes. Weighed at home this AM 166. Will need Phentermine & Trazodone refilled. (checking on status of Wegovy)    Assessment/Plan:   1. Polyphagia Controlled with current medication.  Having again reminded the patient of the "off label" use of Phentermine beyond three consecutive months, and again discussing the risks, benefits, contraindications, and limitations of it's use; given it's role in the successful treatment of obesity thus far and lack of adverse effect, patient has expressed desire and given informed verbal consent to continue use.   I have consulted the [place] Controlled Substances Registry for this patient, and feel the risk/benefit ratio today is favorable for proceeding with this prescription for a controlled substance. The patient understands monitoring parameters and red flags.   - Phentermine HCl 8 MG TABS; Take 1 tablet by mouth daily.  Dispense: 30 tablet; Refill: 0  2. Prediabetes Diabetes Mellitus: At goal. Issues reviewed: blood sugar goals, complications of diabetes mellitus, hypoglycemia prevention and treatment, exercise, and nutrition. Plan: The patient will continue to focus on protein-rich, low simple  carbohydrate foods. We reviewed the importance of hydration, regular exercise for stress reduction, and restorative sleep.   Lab Results  Component Value Date   HGBA1C 5.7 (H) 11/18/2019   HGBA1C 5.4 02/04/2019   Lab Results  Component Value Date   LDLCALC 96 11/18/2019   CREATININE 0.89 11/18/2019   3. Acquired hypothyroidism, hashimoto's Course: At goal. Medication: Levothyroxine. Plan: Patient was instructed not to take MVM or iron within 4 hours of taking thyroid medications. We will continue to monitor alongside Endocrinology/PCP as it relates to her weight loss journey.   Lab Results  Component Value Date   TSH 1.960 11/18/2019   4. Other insomnia This is moderately controlled. Plan: Recommend sleep hygiene measures including regular sleep schedule, optimal sleep environment, and relaxing presleep rituals.   - traZODone (DESYREL) 50 MG tablet; Take 1 tablet (50 mg total) by mouth at bedtime as needed for sleep.  Dispense: 90 tablet; Refill: 0  5. Obesity, current BMI 28.49  Jane Avila is currently in the action stage of change. As such, her goal is to continue with weight loss efforts. She has agreed to practicing portion control and making smarter food choices, such as increasing vegetables and decreasing simple carbohydrates.   Exercise goals: For substantial health benefits, adults should do at least 150 minutes (2 hours and 30 minutes) a week of moderate-intensity, or 75 minutes (1 hour and 15 minutes) a week of vigorous-intensity aerobic physical activity, or an equivalent combination of moderate- and vigorous-intensity aerobic activity. Aerobic activity should be performed in episodes of at least 10 minutes, and preferably, it should be spread throughout the week. Adults should also include muscle-strengthening activities that involve all major muscle groups on  2 or more days a week.  Behavioral modification strategies: increasing lean protein intake, decreasing simple  carbohydrates, increasing vegetables and increasing water intake.  Jane Avila has agreed to follow-up with our clinic in 4 weeks. She was informed of the importance of frequent follow-up visits to maximize her success with intensive lifestyle modifications for her multiple health conditions.  Objective:   VITALS: Per patient if applicable, see vitals. GENERAL: Alert and in no acute distress. CARDIOPULMONARY: No increased WOB. Speaking in clear sentences.  PSYCH: Pleasant and cooperative. Speech normal rate and rhythm. Affect is appropriate. Insight and judgement are appropriate. Attention is focused, linear, and appropriate.  NEURO: Oriented as arrived to appointment on time with no prompting.   Lab Results  Component Value Date   CREATININE 0.89 11/18/2019   BUN 12 11/18/2019   NA 137 11/18/2019   K 4.2 11/18/2019   CL 99 11/18/2019   CO2 24 11/18/2019   Lab Results  Component Value Date   ALT 19 11/18/2019   AST 19 11/18/2019   ALKPHOS 76 11/18/2019   BILITOT 0.6 11/18/2019   Lab Results  Component Value Date   HGBA1C 5.7 (H) 11/18/2019   HGBA1C 5.4 02/04/2019   Lab Results  Component Value Date   INSULIN 16.5 11/18/2019   INSULIN 9.1 02/04/2019   Lab Results  Component Value Date   TSH 1.960 11/18/2019   Lab Results  Component Value Date   CHOL 167 11/18/2019   HDL 56 11/18/2019   LDLCALC 96 11/18/2019   TRIG 78 11/18/2019   CHOLHDL 2.5 07/06/2014   Lab Results  Component Value Date   WBC 7.5 11/18/2019   HGB 14.7 11/18/2019   HCT 45.0 11/18/2019   MCV 90 11/18/2019   PLT 346 11/18/2019   Attestation Statements:   Reviewed by clinician on day of visit: allergies, medications, problem list, medical history, surgical history, family history, social history, and previous encounter notes.

## 2020-08-17 ENCOUNTER — Ambulatory Visit (INDEPENDENT_AMBULATORY_CARE_PROVIDER_SITE_OTHER): Payer: BC Managed Care – PPO | Admitting: Family Medicine

## 2020-09-07 ENCOUNTER — Ambulatory Visit (INDEPENDENT_AMBULATORY_CARE_PROVIDER_SITE_OTHER): Payer: BC Managed Care – PPO | Admitting: Family Medicine

## 2020-09-07 ENCOUNTER — Encounter (INDEPENDENT_AMBULATORY_CARE_PROVIDER_SITE_OTHER): Payer: Self-pay | Admitting: Family Medicine

## 2020-09-07 ENCOUNTER — Other Ambulatory Visit: Payer: Self-pay

## 2020-09-07 VITALS — BP 115/79 | HR 98 | Temp 98.2°F | Ht 64.0 in | Wt 166.0 lb

## 2020-09-07 DIAGNOSIS — I10 Essential (primary) hypertension: Secondary | ICD-10-CM | POA: Diagnosis not present

## 2020-09-07 DIAGNOSIS — F3289 Other specified depressive episodes: Secondary | ICD-10-CM | POA: Diagnosis not present

## 2020-09-07 DIAGNOSIS — E669 Obesity, unspecified: Secondary | ICD-10-CM

## 2020-09-07 DIAGNOSIS — Z6832 Body mass index (BMI) 32.0-32.9, adult: Secondary | ICD-10-CM

## 2020-09-07 DIAGNOSIS — Z9189 Other specified personal risk factors, not elsewhere classified: Secondary | ICD-10-CM | POA: Diagnosis not present

## 2020-09-07 DIAGNOSIS — G4709 Other insomnia: Secondary | ICD-10-CM | POA: Diagnosis not present

## 2020-09-07 DIAGNOSIS — E039 Hypothyroidism, unspecified: Secondary | ICD-10-CM

## 2020-09-07 MED ORDER — TOPIRAMATE 25 MG PO TABS: 25.0000 mg | ORAL_TABLET | Freq: Two times a day (BID) | ORAL | 0 refills | Status: DC

## 2020-09-07 MED ORDER — PHENTERMINE HCL 8 MG PO TABS: 1.0000 | ORAL_TABLET | Freq: Every day | ORAL | 0 refills | Status: DC

## 2020-09-07 MED ORDER — TRAZODONE HCL 50 MG PO TABS
50.0000 mg | ORAL_TABLET | Freq: Every evening | ORAL | 0 refills | Status: DC | PRN
Start: 2020-09-07 — End: 2020-12-14

## 2020-09-07 MED ORDER — BUPROPION HCL ER (XL) 300 MG PO TB24: 300.0000 mg | ORAL_TABLET | Freq: Every day | ORAL | 0 refills | Status: DC

## 2020-09-07 NOTE — Progress Notes (Signed)
Chief Complaint:   OBESITY Jane Avila is here to discuss her progress with her obesity treatment plan along with follow-up of her obesity related diagnoses. See Medical Weight Management Flowsheet for complete bioelectrical impedance results.  Today's visit was #: 20 Starting weight: 188 lbs Starting date: 04/05/2018 Today's weight: 166 lbs Today's date: 09/07/2020 Weight change since last visit: 0 Total lbs lost to date: 22 lbs Body mass index is 28.49 kg/m.  Total weight loss percentage to date: -11.70%  Interim History: Jane Avila just got back from the beach.  She is going on a 2 week [employer] cruise to [place] soon. Bioimpedance results:  Body fat percentage is improving. Note: AOMs not covered by her insurance.  Nutrition Plan: the Category 2 Plan for 40% of the time. Activity:  Walking for 20-30 minutes 2-3 times per week. Anti-obesity medications: phentermine 8 mg daily. Reported side effects: None.  Assessment/Plan:   1. Other insomnia This is well controlled.  Current treatment: trazodone 50 mg as needed at bedtime for sleep.  Plan:  Continue trazodone. Recommend sleep hygiene measures including regular sleep schedule, optimal sleep environment, and relaxing presleep rituals.   - Refill traZODone (DESYREL) 50 MG tablet; Take 1 tablet (50 mg total) by mouth at bedtime as needed for sleep.  Dispense: 90 tablet; Refill: 0  2. Essential hypertension At goal. Medications: HCTZ 25 mg daily.   Plan: Avoid buying foods that are: processed, frozen, or prepackaged to avoid excess salt. We will watch for signs of hypotension as she continues lifestyle modifications.  BP Readings from Last 3 Encounters:  09/07/20 115/79  06/02/20 118/77  04/27/20 112/79   Lab Results  Component Value Date   CREATININE 0.89 11/18/2019   3. Acquired hypothyroidism, hashimoto's Course: Controlled. Medication: levothyroxine 100 mcg daily.    Plan: Patient was instructed not to take MVM or iron  within 4 hours of taking thyroid medications.  We will continue to monitor alongside Endocrinology/PCP as it relates to her weight loss journey.   Lab Results  Component Value Date   TSH 1.960 11/18/2019   4. Other depression, with emotional eating Not optimized. Medication: Wellbutrin XL 300 mg daily and phentermine 8 mg daily.  Plan:  Continue Wellbutrin and phentermine.  Start Topamax 25 mg twice daily. Behavior modification techniques were discussed today to help deal with emotional/non-hunger eating behaviors.  - Refill buPROPion (WELLBUTRIN XL) 300 MG 24 hr tablet; Take 1 tablet (300 mg total) by mouth daily.  Dispense: 90 tablet; Refill: 0 - Refill Phentermine HCl 8 MG TABS; Take 1 tablet by mouth daily.  Dispense: 30 tablet; Refill: 0 - Start topiramate (TOPAMAX) 25 MG tablet; Take 1 tablet (25 mg total) by mouth 2 (two) times daily.  Dispense: 60 tablet; Refill: 0  5. At risk for side effect of medication Due to Nimisha's current conditions and medications, she is at a higher risk for drug side effect.  At least 8 minutes was spent on counseling her about these concerns today.  We discussed the benefits and potential risks of these medications, and all of patient's concerns were addressed and questions were answered.    6. Obesity, current BMI 28.5  Course: Jane Avila is currently in the action stage of change. As such, her goal is to continue with weight loss efforts.   Nutrition goals: She has agreed to the Category 2 Plan.   Exercise goals: For substantial health benefits, adults should do at least 150 minutes (2 hours and 30 minutes)  a week of moderate-intensity, or 75 minutes (1 hour and 15 minutes) a week of vigorous-intensity aerobic physical activity, or an equivalent combination of moderate- and vigorous-intensity aerobic activity. Aerobic activity should be performed in episodes of at least 10 minutes, and preferably, it should be spread throughout the week.  Behavioral  modification strategies: increasing lean protein intake, decreasing simple carbohydrates, increasing vegetables, and increasing water intake.  Jane Avila has agreed to follow-up with our clinic in 4 weeks. She was informed of the importance of frequent follow-up visits to maximize her success with intensive lifestyle modifications for her multiple health conditions.   Objective:   Blood pressure 115/79, pulse 98, temperature 98.2 F (36.8 C), temperature source Oral, height 5' 4"  (1.626 m), weight 166 lb (75.3 kg), SpO2 98 %. Body mass index is 28.49 kg/m.  General: Cooperative, alert, well developed, in no acute distress. HEENT: Conjunctivae and lids unremarkable. Cardiovascular: Regular rhythm.  Lungs: Normal work of breathing. Neurologic: No focal deficits.   Lab Results  Component Value Date   CREATININE 0.89 11/18/2019   BUN 12 11/18/2019   NA 137 11/18/2019   K 4.2 11/18/2019   CL 99 11/18/2019   CO2 24 11/18/2019   Lab Results  Component Value Date   ALT 19 11/18/2019   AST 19 11/18/2019   ALKPHOS 76 11/18/2019   BILITOT 0.6 11/18/2019   Lab Results  Component Value Date   HGBA1C 5.7 (H) 11/18/2019   HGBA1C 5.4 02/04/2019   Lab Results  Component Value Date   INSULIN 16.5 11/18/2019   INSULIN 9.1 02/04/2019   Lab Results  Component Value Date   TSH 1.960 11/18/2019   Lab Results  Component Value Date   CHOL 167 11/18/2019   HDL 56 11/18/2019   LDLCALC 96 11/18/2019   TRIG 78 11/18/2019   CHOLHDL 2.5 07/06/2014   Lab Results  Component Value Date   WBC 7.5 11/18/2019   HGB 14.7 11/18/2019   HCT 45.0 11/18/2019   MCV 90 11/18/2019   PLT 346 11/18/2019   Attestation Statements:   Reviewed by clinician on day of visit: allergies, medications, problem list, medical history, surgical history, family history, social history, and previous encounter notes.  I, Water quality scientist, CMA, am acting as transcriptionist for Briscoe Deutscher, DO  I have reviewed the  above documentation for accuracy and completeness, and I agree with the above. Briscoe Deutscher, DO

## 2020-10-12 ENCOUNTER — Other Ambulatory Visit: Payer: Self-pay

## 2020-10-12 ENCOUNTER — Encounter (INDEPENDENT_AMBULATORY_CARE_PROVIDER_SITE_OTHER): Payer: Self-pay | Admitting: Family Medicine

## 2020-10-12 ENCOUNTER — Ambulatory Visit (INDEPENDENT_AMBULATORY_CARE_PROVIDER_SITE_OTHER): Payer: BC Managed Care – PPO | Admitting: Family Medicine

## 2020-10-12 VITALS — BP 114/77 | HR 71 | Temp 98.0°F | Ht 64.0 in | Wt 167.0 lb

## 2020-10-12 DIAGNOSIS — E039 Hypothyroidism, unspecified: Secondary | ICD-10-CM

## 2020-10-12 DIAGNOSIS — Z124 Encounter for screening for malignant neoplasm of cervix: Secondary | ICD-10-CM | POA: Diagnosis not present

## 2020-10-12 DIAGNOSIS — F3289 Other specified depressive episodes: Secondary | ICD-10-CM

## 2020-10-12 DIAGNOSIS — R7301 Impaired fasting glucose: Secondary | ICD-10-CM | POA: Diagnosis not present

## 2020-10-12 DIAGNOSIS — Z6832 Body mass index (BMI) 32.0-32.9, adult: Secondary | ICD-10-CM

## 2020-10-12 DIAGNOSIS — Z6830 Body mass index (BMI) 30.0-30.9, adult: Secondary | ICD-10-CM | POA: Diagnosis not present

## 2020-10-12 DIAGNOSIS — Z1231 Encounter for screening mammogram for malignant neoplasm of breast: Secondary | ICD-10-CM | POA: Diagnosis not present

## 2020-10-12 DIAGNOSIS — Z9189 Other specified personal risk factors, not elsewhere classified: Secondary | ICD-10-CM | POA: Diagnosis not present

## 2020-10-12 DIAGNOSIS — E669 Obesity, unspecified: Secondary | ICD-10-CM | POA: Diagnosis not present

## 2020-10-12 DIAGNOSIS — Z01419 Encounter for gynecological examination (general) (routine) without abnormal findings: Secondary | ICD-10-CM | POA: Diagnosis not present

## 2020-10-12 MED ORDER — TIRZEPATIDE 2.5 MG/0.5ML ~~LOC~~ SOAJ: 2.5000 mg | SUBCUTANEOUS | 0 refills | Status: DC

## 2020-10-18 NOTE — Progress Notes (Signed)
Chief Complaint:   OBESITY Jane Avila is here to discuss her progress with her obesity treatment plan along with follow-up of her obesity related diagnoses.   Today's visit was #: 21 Starting weight: 188 lbs Starting date: 04/05/2018 Today's weight: 167 lbs Today's date: 10/12/2020 Weight change since last visit: +1 lb Total lbs lost to date: 21 lbs Body mass index is 28.67 kg/m.  Total weight loss percentage to date: -11.17%  Interim History:  Gaila says she enjoyed her [place] cruise with family.  She endorses polyphagia.  She says the phentermine/topiramate combination is helpful.  Mancel Parsons was much more effective (unable to continue due to supply issues).  Current Meal Plan: the Category 2 Plan for 30% of the time.  Current Exercise Plan: Cardio for 45 minutes 5-7 times per week. Current Anti-Obesity Medications: phentermine 8 mg daily, topiramate 25 mg twice daily. Side effects: None.  Assessment/Plan:   Meds ordered this encounter  Medications   tirzepatide (MOUNJARO) 2.5 MG/0.5ML Pen    Sig: Inject 2.5 mg into the skin once a week.    Dispense:  2 mL    Refill:  0    1. Impaired fasting glucose After discussion, patient would like to start below medication. Expectations, risks, and potential side effects reviewed.   - Start tirzepatide [facility]) 2.5 MG/0.5ML Pen; Inject 2.5 mg into the skin once a week.  Dispense: 2 mL; Refill: 0  2. Acquired hypothyroidism Course: controlled. Medication: levothyroxine 100 mcg daily.   Plan: Patient was instructed not to take MVM or iron within 4 hours of taking thyroid medications.  We will continue to monitor alongside Endocrinology/PCP as it relates to her weight loss journey.   Lab Results  Component Value Date   TSH 1.960 11/18/2019   3. Other depression Majorie is taking Wellbutrin XL 300 mg daily.  She struggling with emotional eating and using food for comfort to the extent that it is negatively impacting her  health. She has been working on behavior modification techniques to help reduce her emotional eating and has been successful.   4. At risk for heart disease Due to Ahnesti's current state of health and medical condition(s), she is at a higher risk for heart disease.  This puts the patient at much greater risk to subsequently develop cardiopulmonary conditions that can significantly affect patient's quality of life in a negative manner.    At least 8 minutes were spent on counseling Helene about these concerns today. Evidence-based interventions for health behavior change were utilized today including the discussion of self monitoring techniques, problem-solving barriers, and SMART goal setting techniques.  Specifically, regarding patient's less desirable eating habits and patterns, we employed the technique of small changes when Janalee has not been able to fully commit to her prudent nutritional plan.  5. Obesity, current BMI 28.8  Course: Phyillis is currently in the action stage of change. As such, her goal is to continue with weight loss efforts.   Nutrition goals: She has agreed to the Category 2 Plan.   Exercise goals:  As is.  Behavioral modification strategies: increasing lean protein intake, decreasing simple carbohydrates, increasing vegetables, and increasing water intake.  Sakoya has agreed to follow-up with our clinic in 4 weeks. She was informed of the importance of frequent follow-up visits to maximize her success with intensive lifestyle modifications for her multiple health conditions.   Objective:   Blood pressure 114/77, pulse 71, temperature 98 F (36.7 C), height 5' 4"  (1.626 m), weight  167 lb (75.8 kg), SpO2 96 %. Body mass index is 28.67 kg/m.  General: Cooperative, alert, well developed, in no acute distress. HEENT: Conjunctivae and lids unremarkable. Cardiovascular: Regular rhythm.  Lungs: Normal work of breathing. Neurologic: No focal deficits.   Lab  Results  Component Value Date   CREATININE 0.89 11/18/2019   BUN 12 11/18/2019   NA 137 11/18/2019   K 4.2 11/18/2019   CL 99 11/18/2019   CO2 24 11/18/2019   Lab Results  Component Value Date   ALT 19 11/18/2019   AST 19 11/18/2019   ALKPHOS 76 11/18/2019   BILITOT 0.6 11/18/2019   Lab Results  Component Value Date   HGBA1C 5.7 (H) 11/18/2019   HGBA1C 5.4 02/04/2019   Lab Results  Component Value Date   INSULIN 16.5 11/18/2019   INSULIN 9.1 02/04/2019   Lab Results  Component Value Date   TSH 1.960 11/18/2019   Lab Results  Component Value Date   CHOL 167 11/18/2019   HDL 56 11/18/2019   LDLCALC 96 11/18/2019   TRIG 78 11/18/2019   CHOLHDL 2.5 07/06/2014   Lab Results  Component Value Date   VD25OH 37.3 11/18/2019   VD25OH 23 (L) 07/06/2014   VD25OH 33 09/28/2011   Lab Results  Component Value Date   WBC 7.5 11/18/2019   HGB 14.7 11/18/2019   HCT 45.0 11/18/2019   MCV 90 11/18/2019   PLT 346 11/18/2019   Attestation Statements:   Reviewed by clinician on day of visit: allergies, medications, problem list, medical history, surgical history, family history, social history, and previous encounter notes.  I, Water quality scientist, CMA, am acting as transcriptionist for Briscoe Deutscher, DO  I have reviewed the above documentation for accuracy and completeness, and I agree with the above. Briscoe Deutscher, DO

## 2020-11-09 ENCOUNTER — Other Ambulatory Visit: Payer: Self-pay

## 2020-11-09 ENCOUNTER — Ambulatory Visit (INDEPENDENT_AMBULATORY_CARE_PROVIDER_SITE_OTHER): Payer: BC Managed Care – PPO | Admitting: Family Medicine

## 2020-11-09 ENCOUNTER — Encounter (INDEPENDENT_AMBULATORY_CARE_PROVIDER_SITE_OTHER): Payer: Self-pay | Admitting: Family Medicine

## 2020-11-09 VITALS — BP 117/80 | HR 81 | Temp 98.4°F | Ht 64.0 in | Wt 164.0 lb

## 2020-11-09 DIAGNOSIS — E038 Other specified hypothyroidism: Secondary | ICD-10-CM | POA: Diagnosis not present

## 2020-11-09 DIAGNOSIS — F3289 Other specified depressive episodes: Secondary | ICD-10-CM | POA: Diagnosis not present

## 2020-11-09 DIAGNOSIS — E669 Obesity, unspecified: Secondary | ICD-10-CM

## 2020-11-09 DIAGNOSIS — R7301 Impaired fasting glucose: Secondary | ICD-10-CM

## 2020-11-09 DIAGNOSIS — Z9189 Other specified personal risk factors, not elsewhere classified: Secondary | ICD-10-CM

## 2020-11-09 DIAGNOSIS — Z6832 Body mass index (BMI) 32.0-32.9, adult: Secondary | ICD-10-CM

## 2020-11-09 MED ORDER — TIRZEPATIDE 2.5 MG/0.5ML ~~LOC~~ SOAJ: 2.5000 mg | SUBCUTANEOUS | 0 refills | Status: DC

## 2020-11-09 MED ORDER — BUPROPION HCL ER (XL) 300 MG PO TB24: 300.0000 mg | ORAL_TABLET | Freq: Every day | ORAL | 0 refills | Status: DC

## 2020-11-09 MED ORDER — TIRZEPATIDE 5 MG/0.5ML ~~LOC~~ SOAJ: 5.0000 mg | SUBCUTANEOUS | 0 refills | Status: DC

## 2020-11-10 NOTE — Progress Notes (Signed)
Chief Complaint:   OBESITY Jane Avila is here to discuss her progress with her obesity treatment plan along with follow-up of her obesity related diagnoses.   Today's visit was #: 22 Starting weight: 188 lbs Starting date: 04/05/2018 Today's weight: 164 lbs Today's date: 11/09/2020 Weight change since last visit: 3 lbs Total lbs lost to date: 24 lbs Body mass index is 28.15 kg/m.  Total weight loss percentage to date: -12.77%  Current Meal Plan: the Category 2 Plan for 70% of the time.  Current Exercise Plan: Walking and playing golf for 40+ minutes 4 times per week. Current Anti-Obesity Medications: Mounjaro 2.5 mg subcutaneously weekly. Side effects: None.  Interim History:  Jane Avila is doing well.  Tolerating Mounjaro well.  Assessment/Plan:   1. Impaired fasting glucose, with polyphagia Improving. Jane Avila is taking Mounjaro 2.5 mg subcutaneously weekly.  Plan:  Increase Mounjaro to 5 mg subcutaneously weekly, as per below.  - Start tirzepatide [facility]) 5 MG/0.5ML Pen; Inject 5 mg into the skin once a week.  Dispense: 2 mL; Refill: 0  2. Other specified hypothyroidism Course: Controlled. Medication: levothyroxine 100 mcg daily.   Plan: Patient was instructed not to take MVM or iron within 4 hours of taking thyroid medications.  We will continue to monitor alongside Endocrinology/PCP as it relates to her weight loss journey.   Lab Results  Component Value Date   TSH 1.960 11/18/2019   3. Other depression, with emotional eating Controlled. Medication: Wellbutrin XL 300 mg daily.  Plan:  Discussed cues and consequences, how thoughts affect eating, model of thoughts, feelings, and behaviors, and strategies for change by focusing on the cue. Discussed cognitive distortions, coping thoughts, and how to change your thoughts.  - Refill buPROPion (WELLBUTRIN XL) 300 MG 24 hr tablet; Take 1 tablet (300 mg total) by mouth daily.  Dispense: 90 tablet; Refill: 0  4. At  risk for nausea [name] is at risk for nausea due to increasing her Mounjaro dose. We reviewed ways to decrease side effect of nausea through eating small meals, avoiding high sugar or fat foods, and ultimately going back on the dose if not tolerating. Time > 9 minutes.  5. Obesity, current BMI 28.2  Course: Jane Avila is currently in the action stage of change. As such, her goal is to continue with weight loss efforts.   Nutrition goals: She has agreed to the Category 2 Plan.   Exercise goals: For substantial health benefits, adults should do at least 150 minutes (2 hours and 30 minutes) a week of moderate-intensity, or 75 minutes (1 hour and 15 minutes) a week of vigorous-intensity aerobic physical activity, or an equivalent combination of moderate- and vigorous-intensity aerobic activity. Aerobic activity should be performed in episodes of at least 10 minutes, and preferably, it should be spread throughout the week.  Behavioral modification strategies: increasing lean protein intake, decreasing simple carbohydrates, and increasing vegetables.  Jane Avila has agreed to follow-up with our clinic in 4 weeks. She was informed of the importance of frequent follow-up visits to maximize her success with intensive lifestyle modifications for her multiple health conditions.   Objective:   Blood pressure 117/80, pulse 81, temperature 98.4 F (36.9 C), temperature source Oral, height 5' 4"  (1.626 m), weight 164 lb (74.4 kg), SpO2 97 %. Body mass index is 28.15 kg/m.  General: Cooperative, alert, well developed, in no acute distress. HEENT: Conjunctivae and lids unremarkable. Cardiovascular: Regular rhythm.  Lungs: Normal work of breathing. Neurologic: No focal deficits.   Lab  Results  Component Value Date   CREATININE 0.89 11/18/2019   BUN 12 11/18/2019   NA 137 11/18/2019   K 4.2 11/18/2019   CL 99 11/18/2019   CO2 24 11/18/2019   Lab Results  Component Value Date   ALT 19 11/18/2019    AST 19 11/18/2019   ALKPHOS 76 11/18/2019   BILITOT 0.6 11/18/2019   Lab Results  Component Value Date   HGBA1C 5.7 (H) 11/18/2019   HGBA1C 5.4 02/04/2019   Lab Results  Component Value Date   INSULIN 16.5 11/18/2019   INSULIN 9.1 02/04/2019   Lab Results  Component Value Date   TSH 1.960 11/18/2019   Lab Results  Component Value Date   CHOL 167 11/18/2019   HDL 56 11/18/2019   LDLCALC 96 11/18/2019   TRIG 78 11/18/2019   CHOLHDL 2.5 07/06/2014   Lab Results  Component Value Date   VD25OH 37.3 11/18/2019   VD25OH 23 (L) 07/06/2014   VD25OH 33 09/28/2011   Lab Results  Component Value Date   WBC 7.5 11/18/2019   HGB 14.7 11/18/2019   HCT 45.0 11/18/2019   MCV 90 11/18/2019   PLT 346 11/18/2019   Attestation Statements:   Reviewed by clinician on day of visit: allergies, medications, problem list, medical history, surgical history, family history, social history, and previous encounter notes.  I, Water quality scientist, CMA, am acting as transcriptionist for Briscoe Deutscher, DO  I have reviewed the above documentation for accuracy and completeness, and I agree with the above. Briscoe Deutscher, DO

## 2020-12-14 ENCOUNTER — Other Ambulatory Visit: Payer: Self-pay

## 2020-12-14 ENCOUNTER — Encounter (INDEPENDENT_AMBULATORY_CARE_PROVIDER_SITE_OTHER): Payer: Self-pay | Admitting: Family Medicine

## 2020-12-14 ENCOUNTER — Ambulatory Visit (INDEPENDENT_AMBULATORY_CARE_PROVIDER_SITE_OTHER): Payer: BC Managed Care – PPO | Admitting: Family Medicine

## 2020-12-14 VITALS — BP 137/86 | HR 61 | Temp 98.2°F | Ht 64.0 in | Wt 165.0 lb

## 2020-12-14 DIAGNOSIS — F3289 Other specified depressive episodes: Secondary | ICD-10-CM

## 2020-12-14 DIAGNOSIS — E669 Obesity, unspecified: Secondary | ICD-10-CM | POA: Diagnosis not present

## 2020-12-14 DIAGNOSIS — R7301 Impaired fasting glucose: Secondary | ICD-10-CM

## 2020-12-14 DIAGNOSIS — Z9189 Other specified personal risk factors, not elsewhere classified: Secondary | ICD-10-CM

## 2020-12-14 DIAGNOSIS — G4709 Other insomnia: Secondary | ICD-10-CM

## 2020-12-14 DIAGNOSIS — Z6832 Body mass index (BMI) 32.0-32.9, adult: Secondary | ICD-10-CM

## 2020-12-14 MED ORDER — TIRZEPATIDE 5 MG/0.5ML ~~LOC~~ SOAJ: 5.0000 mg | SUBCUTANEOUS | 0 refills | Status: DC

## 2020-12-14 MED ORDER — BUPROPION HCL ER (XL) 300 MG PO TB24: 300.0000 mg | ORAL_TABLET | Freq: Every day | ORAL | 0 refills | Status: DC

## 2020-12-14 MED ORDER — TRAZODONE HCL 50 MG PO TABS: 50.0000 mg | ORAL_TABLET | Freq: Every evening | ORAL | 0 refills | Status: DC | PRN

## 2020-12-15 NOTE — Progress Notes (Signed)
Chief Complaint:   OBESITY Jane Avila is here to discuss her progress with her obesity treatment plan along with follow-up of her obesity related diagnoses.   Today's visit was #: 23 Starting weight: 188 lbs Starting date: 04/05/2018 Today's weight: 165 lbs Today's date: 12/14/2020 Weight change since last visit: +1 Total lbs lost to date: 24 lbs Body mass index is 28.32 kg/m.  Total weight loss percentage to date: -12.23%  Current Meal Plan: the Category 2 Plan for 60% of the time.  Current Exercise Plan: None. Current Anti-Obesity Medications: Mounjaro 2.5 mg subcutaneously weekly. Side effects: None.  Interim History:  Jane Avila took decongestants today, BP slightly elevated. She says she is under increased stress due to her husband working out of town.  Assessment/Plan:   1. Impaired fasting glucose, with polyphagia Controlled. Current treatment: Mounjaro 5 mg subcutaneously weekly. She will continue to focus on protein-rich, low simple carbohydrate foods. We reviewed the importance of hydration, regular exercise for stress reduction, and restorative sleep.  - Refill tirzepatide (MOUNJARO) 5 MG/0.5ML Pen; Inject 5 mg into the skin once a week.  Dispense: 2 mL; Refill: 0  2. Other insomnia This is well controlled.  Current treatment: trazodone 50 mg at bedtime as needed for sleep.  Plan:  Continue trazodone 50 mg at bedtime as needed for sleep.  Recommend sleep hygiene measures including regular sleep schedule, optimal sleep environment, and relaxing presleep rituals.   - Refill traZODone (DESYREL) 50 MG tablet; Take 1 tablet (50 mg total) by mouth at bedtime as needed for sleep.  Dispense: 90 tablet; Refill: 0  3. Other depression, with emotional eating Medication: Wellbutrin XL 300 mg daily.  Plan:  Behavior modification techniques were discussed today to help deal with emotional/non-hunger eating behaviors.  - Refill buPROPion (WELLBUTRIN XL) 300 MG 24 hr tablet;  Take 1 tablet (300 mg total) by mouth daily.  Dispense: 90 tablet; Refill: 0  4. At risk for heart disease Due to Jane Avila's current state of health and medical condition(s), she is at a higher risk for heart disease.  This puts the patient at much greater risk to subsequently develop cardiopulmonary conditions that can significantly affect patient's quality of life in a negative manner.    At least 8 minutes were spent on counseling Jane Avila about these concerns today. Evidence-based interventions for health behavior change were utilized today including the discussion of self monitoring techniques, problem-solving barriers, and SMART goal setting techniques.  Specifically, regarding patient's less desirable eating habits and patterns, we employed the technique of small changes when Jane Avila has not been able to fully commit to her prudent nutritional plan.  5. Obesity, current BMI 28.4  Course: Jane Avila is currently in the action stage of change. As such, her goal is to continue with weight loss efforts.   Nutrition goals: She has agreed to the Category 2 Plan.   Exercise goals: For substantial health benefits, adults should do at least 150 minutes (2 hours and 30 minutes) a week of moderate-intensity, or 75 minutes (1 hour and 15 minutes) a week of vigorous-intensity aerobic physical activity, or an equivalent combination of moderate- and vigorous-intensity aerobic activity. Aerobic activity should be performed in episodes of at least 10 minutes, and preferably, it should be spread throughout the week.  Behavioral modification strategies: increasing lean protein intake, decreasing simple carbohydrates, increasing vegetables, and increasing water intake.  Jane Avila has agreed to follow-up with our clinic in 4 weeks. She was informed of the importance of frequent  follow-up visits to maximize her success with intensive lifestyle modifications for her multiple health conditions.   Objective:    Blood pressure 137/86, pulse 61, temperature 98.2 F (36.8 C), temperature source Oral, height 5' 4"  (1.626 m), weight 165 lb (74.8 kg), SpO2 99 %. Body mass index is 28.32 kg/m.  General: Cooperative, alert, well developed, in no acute distress. HEENT: Conjunctivae and lids unremarkable. Cardiovascular: Regular rhythm.  Lungs: Normal work of breathing. Neurologic: No focal deficits.   Lab Results  Component Value Date   CREATININE 0.89 11/18/2019   BUN 12 11/18/2019   NA 137 11/18/2019   K 4.2 11/18/2019   CL 99 11/18/2019   CO2 24 11/18/2019   Lab Results  Component Value Date   ALT 19 11/18/2019   AST 19 11/18/2019   ALKPHOS 76 11/18/2019   BILITOT 0.6 11/18/2019   Lab Results  Component Value Date   HGBA1C 5.7 (H) 11/18/2019   HGBA1C 5.4 02/04/2019   Lab Results  Component Value Date   INSULIN 16.5 11/18/2019   INSULIN 9.1 02/04/2019   Lab Results  Component Value Date   TSH 1.960 11/18/2019   Lab Results  Component Value Date   CHOL 167 11/18/2019   HDL 56 11/18/2019   LDLCALC 96 11/18/2019   TRIG 78 11/18/2019   CHOLHDL 2.5 07/06/2014   Lab Results  Component Value Date   VD25OH 37.3 11/18/2019   VD25OH 23 (L) 07/06/2014   VD25OH 33 09/28/2011   Lab Results  Component Value Date   WBC 7.5 11/18/2019   HGB 14.7 11/18/2019   HCT 45.0 11/18/2019   MCV 90 11/18/2019   PLT 346 11/18/2019   Attestation Statements:   Reviewed by clinician on day of visit: allergies, medications, problem list, medical history, surgical history, family history, social history, and previous encounter notes.  I, Water quality scientist, CMA, am acting as transcriptionist for Briscoe Deutscher, DO  I have reviewed the above documentation for accuracy and completeness, and I agree with the above. Briscoe Deutscher, DO

## 2020-12-19 ENCOUNTER — Encounter (INDEPENDENT_AMBULATORY_CARE_PROVIDER_SITE_OTHER): Payer: Self-pay

## 2020-12-21 DIAGNOSIS — E038 Other specified hypothyroidism: Secondary | ICD-10-CM | POA: Diagnosis not present

## 2020-12-21 DIAGNOSIS — E063 Autoimmune thyroiditis: Secondary | ICD-10-CM | POA: Diagnosis not present

## 2020-12-28 DIAGNOSIS — E063 Autoimmune thyroiditis: Secondary | ICD-10-CM | POA: Diagnosis not present

## 2020-12-28 DIAGNOSIS — E038 Other specified hypothyroidism: Secondary | ICD-10-CM | POA: Diagnosis not present

## 2020-12-28 DIAGNOSIS — Z79899 Other long term (current) drug therapy: Secondary | ICD-10-CM | POA: Diagnosis not present

## 2021-01-19 ENCOUNTER — Encounter (INDEPENDENT_AMBULATORY_CARE_PROVIDER_SITE_OTHER): Payer: Self-pay | Admitting: Family Medicine

## 2021-01-19 ENCOUNTER — Other Ambulatory Visit: Payer: Self-pay

## 2021-01-19 ENCOUNTER — Ambulatory Visit (INDEPENDENT_AMBULATORY_CARE_PROVIDER_SITE_OTHER): Payer: BC Managed Care – PPO | Admitting: Family Medicine

## 2021-01-19 ENCOUNTER — Other Ambulatory Visit (HOSPITAL_COMMUNITY): Payer: Self-pay

## 2021-01-19 VITALS — BP 137/93 | HR 73 | Temp 97.9°F | Ht 64.0 in | Wt 166.0 lb

## 2021-01-19 DIAGNOSIS — G4709 Other insomnia: Secondary | ICD-10-CM | POA: Diagnosis not present

## 2021-01-19 DIAGNOSIS — R7301 Impaired fasting glucose: Secondary | ICD-10-CM | POA: Diagnosis not present

## 2021-01-19 DIAGNOSIS — Z6832 Body mass index (BMI) 32.0-32.9, adult: Secondary | ICD-10-CM

## 2021-01-19 DIAGNOSIS — E669 Obesity, unspecified: Secondary | ICD-10-CM | POA: Diagnosis not present

## 2021-01-19 DIAGNOSIS — F3289 Other specified depressive episodes: Secondary | ICD-10-CM | POA: Diagnosis not present

## 2021-01-19 MED ORDER — TIRZEPATIDE 5 MG/0.5ML ~~LOC~~ SOAJ
5.0000 mg | SUBCUTANEOUS | 0 refills | Status: DC
  Filled 2021-01-19 (×2): qty 2, 28d supply, fill #0

## 2021-01-19 MED ORDER — BUPROPION HCL ER (XL) 300 MG PO TB24: 300.0000 mg | ORAL_TABLET | Freq: Every day | ORAL | 0 refills | Status: DC

## 2021-01-19 MED ORDER — TRAZODONE HCL 50 MG PO TABS: 50.0000 mg | ORAL_TABLET | Freq: Every evening | ORAL | 0 refills | Status: DC | PRN

## 2021-01-19 MED ORDER — TIRZEPATIDE 5 MG/0.5ML ~~LOC~~ SOAJ: 5.0000 mg | SUBCUTANEOUS | 0 refills | Status: DC

## 2021-01-19 NOTE — Progress Notes (Signed)
Chief Complaint:   OBESITY Jane Avila is here to discuss her progress with her obesity treatment plan along with follow-up of her obesity related diagnoses. See Medical Weight Management Flowsheet for complete bioelectrical impedance results.  Today's visit was #: 24 Starting weight: 188 lbs Starting date: 04/05/2018 Weight change since last visit: +1 lb Total lbs lost to date: 22 lbs Total weight loss percentage to date: -11.70%  Nutrition Plan: Category 2 Meal Plan for 50% of the time. Activity: Walking for 20 minutes 3-4 times per week.  Anti-obesity medications: Mounjaro 5 mg subcutaneously weekly. Reported side effects: None.  Interim History: Shandria just got back from [place].  She says she has several trips planned.  She ws unable to get [facility] secondary to pharmacy.  Assessment/Plan:   1. Other insomnia This is well controlled.  Current treatment: trazodone 50 mg at bedtime.  Plan:  Continue trazodone 50 mg at bedtime as needed for sleep. Recommend sleep hygiene measures including regular sleep schedule, optimal sleep environment, and relaxing presleep rituals.   - Refill traZODone (DESYREL) 50 MG tablet; Take 1 tablet (50 mg total) by mouth at bedtime as needed for sleep.  Dispense: 90 tablet; Refill: 0  2. Impaired fasting glucose, with polyphagia Controlled. Current treatment: Mounjaro 5 mg subcutaneously weekly. She will continue to focus on protein-rich, low simple carbohydrate foods. We reviewed the importance of hydration, regular exercise for stress reduction, and restorative sleep.  - Refill tirzepatide (MOUNJARO) 5 MG/0.5ML Pen; Inject 5 mg into the skin once a week.  Dispense: 2 mL; Refill: 0  3. Other depression, with emotional eating Controlled. Medication: Wellbutrin XL 300 mg daily.   Plan:  Continue Wellbutrin at current dose.  Discussed cues and consequences, how thoughts affect eating, model of thoughts, feelings, and behaviors, and strategies for  change by focusing on the cue. Discussed cognitive distortions, coping thoughts, and how to change your thoughts.  - Refill buPROPion (WELLBUTRIN XL) 300 MG 24 hr tablet; Take 1 tablet (300 mg total) by mouth daily.  Dispense: 90 tablet; Refill: 0  4. Obesity BMI today is 28  Course: Jane Avila is currently in the action stage of change. As such, her goal is to continue with weight loss efforts.   Nutrition goals: She has agreed to the Category 2 Plan.   Exercise goals:  As is.  Behavioral modification strategies: increasing lean protein intake, decreasing simple carbohydrates, and increasing vegetables.  Jane Avila has agreed to follow-up with our clinic in 4 weeks. She was informed of the importance of frequent follow-up visits to maximize her success with intensive lifestyle modifications for her multiple health conditions.   Objective:   Blood pressure (!) 137/93, pulse 73, temperature 97.9 F (36.6 C), temperature source Oral, height 5' 4"  (8.032 m), weight 166 lb (75.3 kg), SpO2 96 %. Body mass index is 28.49 kg/m.  General: Cooperative, alert, well developed, in no acute distress. HEENT: Conjunctivae and lids unremarkable. Cardiovascular: Regular rhythm.  Lungs: Normal work of breathing. Neurologic: No focal deficits.   Lab Results  Component Value Date   CREATININE 0.89 11/18/2019   BUN 12 11/18/2019   NA 137 11/18/2019   K 4.2 11/18/2019   CL 99 11/18/2019   CO2 24 11/18/2019   Lab Results  Component Value Date   ALT 19 11/18/2019   AST 19 11/18/2019   ALKPHOS 76 11/18/2019   BILITOT 0.6 11/18/2019   Lab Results  Component Value Date   HGBA1C 5.7 (H) 11/18/2019  HGBA1C 5.4 02/04/2019   Lab Results  Component Value Date   INSULIN 16.5 11/18/2019   INSULIN 9.1 02/04/2019   Lab Results  Component Value Date   TSH 1.960 11/18/2019   Lab Results  Component Value Date   CHOL 167 11/18/2019   HDL 56 11/18/2019   LDLCALC 96 11/18/2019   TRIG 78 11/18/2019    CHOLHDL 2.5 07/06/2014   Lab Results  Component Value Date   VD25OH 37.3 11/18/2019   VD25OH 23 (L) 07/06/2014   VD25OH 33 09/28/2011   Lab Results  Component Value Date   WBC 7.5 11/18/2019   HGB 14.7 11/18/2019   HCT 45.0 11/18/2019   MCV 90 11/18/2019   PLT 346 11/18/2019   Attestation Statements:   Reviewed by clinician on day of visit: allergies, medications, problem list, medical history, surgical history, family history, social history, and previous encounter notes.  I, Water quality scientist, CMA, am acting as transcriptionist for Briscoe Deutscher, DO  I have reviewed the above documentation for accuracy and completeness, and I agree with the above. -  Briscoe Deutscher, DO, MS, FAAFP, DABOM - Family and Bariatric Medicine.

## 2021-02-16 ENCOUNTER — Encounter (INDEPENDENT_AMBULATORY_CARE_PROVIDER_SITE_OTHER): Payer: Self-pay | Admitting: Family Medicine

## 2021-02-16 DIAGNOSIS — R7301 Impaired fasting glucose: Secondary | ICD-10-CM

## 2021-02-20 NOTE — Telephone Encounter (Signed)
Pt last seen by Dr. Juleen China.

## 2021-02-22 MED ORDER — OZEMPIC (0.25 OR 0.5 MG/DOSE) 2 MG/1.5ML ~~LOC~~ SOPN: 0.5000 mg | PEN_INJECTOR | SUBCUTANEOUS | 0 refills | Status: DC

## 2021-02-22 NOTE — Telephone Encounter (Signed)
Dr.Wallace

## 2021-02-22 NOTE — Telephone Encounter (Signed)
Prior authorization was started for Ozempic this morning. Will notify patient and provider once response is received.

## 2021-02-27 ENCOUNTER — Encounter (INDEPENDENT_AMBULATORY_CARE_PROVIDER_SITE_OTHER): Payer: Self-pay

## 2021-02-27 ENCOUNTER — Telehealth (INDEPENDENT_AMBULATORY_CARE_PROVIDER_SITE_OTHER): Payer: Self-pay | Admitting: Family Medicine

## 2021-02-27 NOTE — Telephone Encounter (Signed)
Prior authorization denied for Ozempic. Patient does not have type 2 diabetes. Patient sent mychart message.

## 2021-03-02 ENCOUNTER — Ambulatory Visit (INDEPENDENT_AMBULATORY_CARE_PROVIDER_SITE_OTHER): Payer: BC Managed Care – PPO | Admitting: Family Medicine

## 2021-03-02 ENCOUNTER — Other Ambulatory Visit: Payer: Self-pay

## 2021-03-02 ENCOUNTER — Ambulatory Visit: Payer: BC Managed Care – PPO | Admitting: Podiatry

## 2021-03-02 ENCOUNTER — Other Ambulatory Visit (INDEPENDENT_AMBULATORY_CARE_PROVIDER_SITE_OTHER): Payer: Self-pay | Admitting: Family Medicine

## 2021-03-02 ENCOUNTER — Encounter (INDEPENDENT_AMBULATORY_CARE_PROVIDER_SITE_OTHER): Payer: Self-pay | Admitting: Family Medicine

## 2021-03-02 ENCOUNTER — Other Ambulatory Visit (HOSPITAL_COMMUNITY): Payer: Self-pay

## 2021-03-02 ENCOUNTER — Encounter: Payer: Self-pay | Admitting: Podiatry

## 2021-03-02 VITALS — BP 123/84 | HR 69 | Temp 98.0°F | Ht 64.0 in | Wt 159.0 lb

## 2021-03-02 DIAGNOSIS — B351 Tinea unguium: Secondary | ICD-10-CM | POA: Diagnosis not present

## 2021-03-02 DIAGNOSIS — Z6832 Body mass index (BMI) 32.0-32.9, adult: Secondary | ICD-10-CM

## 2021-03-02 DIAGNOSIS — F3289 Other specified depressive episodes: Secondary | ICD-10-CM

## 2021-03-02 DIAGNOSIS — G4709 Other insomnia: Secondary | ICD-10-CM

## 2021-03-02 DIAGNOSIS — E669 Obesity, unspecified: Secondary | ICD-10-CM

## 2021-03-02 DIAGNOSIS — R632 Polyphagia: Secondary | ICD-10-CM

## 2021-03-02 DIAGNOSIS — R7301 Impaired fasting glucose: Secondary | ICD-10-CM

## 2021-03-02 MED ORDER — TERBINAFINE HCL 250 MG PO TABS
ORAL_TABLET | ORAL | 0 refills | Status: DC
Start: 2021-03-02 — End: 2021-11-30

## 2021-03-02 MED ORDER — BUPROPION HCL ER (XL) 300 MG PO TB24: 300.0000 mg | ORAL_TABLET | Freq: Every day | ORAL | 0 refills | Status: DC

## 2021-03-02 MED ORDER — TRAZODONE HCL 50 MG PO TABS: 50.0000 mg | ORAL_TABLET | Freq: Every evening | ORAL | 0 refills | Status: DC | PRN

## 2021-03-02 MED ORDER — OZEMPIC (1 MG/DOSE) 4 MG/3ML ~~LOC~~ SOPN
1.0000 mg | PEN_INJECTOR | SUBCUTANEOUS | 0 refills | Status: DC
Start: 2021-03-02 — End: 2021-06-07

## 2021-03-02 NOTE — Progress Notes (Signed)
Chief Complaint:   OBESITY Jane Avila is here to discuss her progress with her obesity treatment plan along with follow-up of her obesity related diagnoses. See Medical Weight Management Flowsheet for complete bioelectrical impedance results.  Today's visit was #: 25 Starting weight: 188 lbs Starting date: 04/05/2018 Weight change since last visit: 7 lbs Total lbs lost to date: 29 lbs Total weight loss percentage to date: -15.43%  Nutrition Plan: Category 2 Plan for 80% of the time.  Activity: Increased walking. Anti-obesity medications: Ozempic 0.5 mg subcutaneously weekly. Reported side effects: None.  Interim History: Jane Avila says she was unable to get coverage for any GLP-1RA.  She says she is willing to pay for it.  Assessment/Plan:   1. Polyphagia Not at goal. Current treatment: Ozempic 0.5 mg subcutaneously weekly.    Plan:  Increase Ozempic to 1 mg subcutaneously weekly, as per below. She will continue to focus on protein-rich, low simple carbohydrate foods. We reviewed the importance of hydration, regular exercise for stress reduction, and restorative sleep.  - Increase Semaglutide, 1 MG/DOSE, (OZEMPIC, 1 MG/DOSE,) 4 MG/3ML SOPN; Inject 1 mg into the skin once a week.  Dispense: 9 mL; Refill: 0  2. Other insomnia This is well controlled.  Current treatment: trazodone 50 mg at bedtime as needed.  Plan: Continue trazodone 50 mg at bedtime as needed.  Will refill today, as per below.  Recommend sleep hygiene measures including regular sleep schedule, optimal sleep environment, and relaxing presleep rituals.   - Refill traZODone (DESYREL) 50 MG tablet; Take 1 tablet (50 mg total) by mouth at bedtime as needed for sleep.  Dispense: 90 tablet; Refill: 0  3. Other depression, with emotional eating Controlled. Medication: Wellbutrin XL 300 mg daily.    Plan:  Continue Wellbutrin at current dose.  Discussed cues and consequences, how thoughts affect eating, model of  thoughts, feelings, and behaviors, and strategies for change by focusing on the cue. Discussed cognitive distortions, coping thoughts, and how to change your thoughts.  - Refill buPROPion (WELLBUTRIN XL) 300 MG 24 hr tablet; Take 1 tablet (300 mg total) by mouth daily.  Dispense: 90 tablet; Refill: 0  4. Obesity BMI today is 27.4  Course: Jane Avila is currently in the action stage of change. As such, her goal is to continue with weight loss efforts.   Nutrition goals: She has agreed to the Category 2 Plan.   Exercise goals:  As is.  Behavioral modification strategies: increasing lean protein intake, decreasing simple carbohydrates, increasing vegetables, and increasing water intake.  Jane Avila has agreed to follow-up with our clinic in 4 weeks. She was informed of the importance of frequent follow-up visits to maximize her success with intensive lifestyle modifications for her multiple health conditions.   Objective:   Blood pressure 123/84, pulse 69, temperature 98 F (36.7 C), temperature source Oral, height 5' 4"  (1.626 m), weight 159 lb (72.1 kg), SpO2 98 %. Body mass index is 27.29 kg/m.  General: Cooperative, alert, well developed, in no acute distress. HEENT: Conjunctivae and lids unremarkable. Cardiovascular: Regular rhythm.  Lungs: Normal work of breathing. Neurologic: No focal deficits.   Lab Results  Component Value Date   CREATININE 0.89 11/18/2019   BUN 12 11/18/2019   NA 137 11/18/2019   K 4.2 11/18/2019   CL 99 11/18/2019   CO2 24 11/18/2019   Lab Results  Component Value Date   ALT 19 11/18/2019   AST 19 11/18/2019   ALKPHOS 76 11/18/2019   BILITOT  0.6 11/18/2019   Lab Results  Component Value Date   HGBA1C 5.7 (H) 11/18/2019   HGBA1C 5.4 02/04/2019   Lab Results  Component Value Date   INSULIN 16.5 11/18/2019   INSULIN 9.1 02/04/2019   Lab Results  Component Value Date   TSH 1.960 11/18/2019   Lab Results  Component Value Date   CHOL 167  11/18/2019   HDL 56 11/18/2019   LDLCALC 96 11/18/2019   TRIG 78 11/18/2019   CHOLHDL 2.5 07/06/2014   Lab Results  Component Value Date   VD25OH 37.3 11/18/2019   VD25OH 23 (L) 07/06/2014   VD25OH 33 09/28/2011   Lab Results  Component Value Date   WBC 7.5 11/18/2019   HGB 14.7 11/18/2019   HCT 45.0 11/18/2019   MCV 90 11/18/2019   PLT 346 11/18/2019   Attestation Statements:   Reviewed by clinician on day of visit: allergies, medications, problem list, medical history, surgical history, family history, social history, and previous encounter notes.  I, Water quality scientist, CMA, am acting as transcriptionist for Briscoe Deutscher, DO  I have reviewed the above documentation for accuracy and completeness, and I agree with the above. -  Briscoe Deutscher, DO, MS, FAAFP, DABOM - Family and Bariatric Medicine.

## 2021-03-04 NOTE — Progress Notes (Signed)
Subjective:   Patient ID: Jane Avila, female   DOB: 50 y.o.   MRN: [id number]   HPI Patient presents stating she is developed moderate discoloration of her hallux toenails bilateral and slight discoloration of adjacent nails over the last few months.  She does not remember specific injury but she is concerned about fungus and has not been wearing polish recently.  Patient does not smoke likes to be active   Review of Systems  All other systems reviewed and are negative.      Objective:  Physical Exam Vitals and nursing note reviewed.  Constitutional:      Appearance: She is well-developed.  Pulmonary:     Effort: Pulmonary effort is normal.  Musculoskeletal:        General: Normal range of motion.  Skin:    General: Skin is warm.  Neurological:     Mental Status: She is alert.    Neurovascular status intact muscle strength was found to be adequate discoloration of the distal two thirds of the nailbed bilateral that is localized with nail infection of adjacent nails of a low-grade nature.  Good digital perfusion well oriented x3     Assessment:  Difficult to make complete determination between fungal infection or possibility of trauma to the nailbeds with discoloration pattern     Plan:  H&P educated her on the different conditions and at this point we are going to start her on a pulse Lamisil treatment of 1 pill a day 7 days for 4 months and only doing that once a month.  Also will start laser therapy in January and will have approximately 3 laser treatments to the affected nails and does understand this may or may not cure the condition pressed

## 2021-03-31 ENCOUNTER — Other Ambulatory Visit: Payer: Self-pay

## 2021-03-31 ENCOUNTER — Ambulatory Visit (INDEPENDENT_AMBULATORY_CARE_PROVIDER_SITE_OTHER): Payer: BC Managed Care – PPO

## 2021-03-31 DIAGNOSIS — B351 Tinea unguium: Secondary | ICD-10-CM

## 2021-03-31 NOTE — Progress Notes (Signed)
Patient presents today for the 1st laser treatment. Diagnosed with mycotic nail infection by Dr. Paulla Dolly.   Toenail most affected bilateral hallux.  All other systems are negative.  Nails were filed thin. Laser therapy was administered to bilateral great toenails  and patient tolerated the treatment well. All safety precautions were in place.    Follow up in 4 weeks for laser # 2nd.  Picture of nails taken today to document visual progress

## 2021-03-31 NOTE — Patient Instructions (Signed)

## 2021-04-19 ENCOUNTER — Other Ambulatory Visit: Payer: Self-pay

## 2021-04-19 ENCOUNTER — Encounter (INDEPENDENT_AMBULATORY_CARE_PROVIDER_SITE_OTHER): Payer: Self-pay | Admitting: Family Medicine

## 2021-04-19 ENCOUNTER — Ambulatory Visit (INDEPENDENT_AMBULATORY_CARE_PROVIDER_SITE_OTHER): Payer: BC Managed Care – PPO | Admitting: Family Medicine

## 2021-04-19 VITALS — BP 121/85 | HR 91 | Temp 98.1°F | Ht 64.0 in | Wt 159.0 lb

## 2021-04-19 DIAGNOSIS — R632 Polyphagia: Secondary | ICD-10-CM

## 2021-04-19 DIAGNOSIS — E039 Hypothyroidism, unspecified: Secondary | ICD-10-CM

## 2021-04-19 DIAGNOSIS — E669 Obesity, unspecified: Secondary | ICD-10-CM | POA: Diagnosis not present

## 2021-04-19 DIAGNOSIS — R7302 Impaired glucose tolerance (oral): Secondary | ICD-10-CM | POA: Diagnosis not present

## 2021-04-19 DIAGNOSIS — Z6827 Body mass index (BMI) 27.0-27.9, adult: Secondary | ICD-10-CM

## 2021-04-20 NOTE — Progress Notes (Signed)
Chief Complaint:   OBESITY Jane Avila is here to discuss her progress with her obesity treatment plan along with follow-up of her obesity related diagnoses. See Medical Weight Management Flowsheet for complete bioelectrical impedance results.  Today's visit was #: 26 Starting weight: 188 lbs Starting date: 04/05/2018 Weight change since last visit: 0 Total lbs lost to date: 29 lbs Total weight loss percentage to date: -15.43%  Nutrition Plan: Category 2 Plan for 50% of the time.  Activity: Walking for 20-30 minutes 3-4 times per week.   Interim History: Jane Avila has gained 3 pounds of muscle and has lost 3 pounds of fat.  Her fat percentage has gone from 36.8 to 34.9.  Assessment/Plan:   1. Impaired glucose tolerance Will retry Ozempic PA. She will continue to focus on protein-rich, low simple carbohydrate foods. We reviewed the importance of hydration, regular exercise for stress reduction, and restorative sleep.   2. Acquired hypothyroidism Course: Stable. Medication: levothyroxine 100 mg daily.   Plan: The current medical regimen is effective;  continue present plan and medications.  Lab Results  Component Value Date   TSH 1.960 11/18/2019   3. Polyphagia Improving, but not optimized. Current treatment: None.  Ozempic PA was denied.  She will continue to obtain this through a reputable [place].  Plan: She will continue to focus on protein-rich, low simple carbohydrate foods. We reviewed the importance of hydration, regular exercise for stress reduction, and restorative sleep.  4. Obesity BMI today is 27.3  Course: Jane Avila is currently in the action stage of change. As such, her goal is to continue with weight loss efforts.   Nutrition goals: She has agreed to the Category 2 Plan.   Exercise goals:  As is.  Behavioral modification strategies: increasing lean protein intake, decreasing simple carbohydrates, increasing vegetables, and increasing water  intake.  Jane Avila has agreed to follow-up with our clinic in 4 weeks. She was informed of the importance of frequent follow-up visits to maximize her success with intensive lifestyle modifications for her multiple health conditions.   Objective:   Blood pressure 121/85, pulse 91, temperature 98.1 F (36.7 C), temperature source Oral, height 5' 4"  (1.626 m), weight 159 lb (72.1 kg), SpO2 97 %. Body mass index is 27.29 kg/m.  General: Cooperative, alert, well developed, in no acute distress. HEENT: Conjunctivae and lids unremarkable. Cardiovascular: Regular rhythm.  Lungs: Normal work of breathing. Neurologic: No focal deficits.   Lab Results  Component Value Date   CREATININE 0.89 11/18/2019   BUN 12 11/18/2019   NA 137 11/18/2019   K 4.2 11/18/2019   CL 99 11/18/2019   CO2 24 11/18/2019   Lab Results  Component Value Date   ALT 19 11/18/2019   AST 19 11/18/2019   ALKPHOS 76 11/18/2019   BILITOT 0.6 11/18/2019   Lab Results  Component Value Date   HGBA1C 5.7 (H) 11/18/2019   HGBA1C 5.4 02/04/2019   Lab Results  Component Value Date   INSULIN 16.5 11/18/2019   INSULIN 9.1 02/04/2019   Lab Results  Component Value Date   TSH 1.960 11/18/2019   Lab Results  Component Value Date   CHOL 167 11/18/2019   HDL 56 11/18/2019   LDLCALC 96 11/18/2019   TRIG 78 11/18/2019   CHOLHDL 2.5 07/06/2014   Lab Results  Component Value Date   VD25OH 37.3 11/18/2019   VD25OH 23 (L) 07/06/2014   VD25OH 33 09/28/2011   Lab Results  Component Value Date  WBC 7.5 11/18/2019   HGB 14.7 11/18/2019   HCT 45.0 11/18/2019   MCV 90 11/18/2019   PLT 346 11/18/2019   Attestation Statements:   Reviewed by clinician on day of visit: allergies, medications, problem list, medical history, surgical history, family history, social history, and previous encounter notes.  I, Water quality scientist, CMA, am acting as transcriptionist for Briscoe Deutscher, DO  I have reviewed the above documentation  for accuracy and completeness, and I agree with the above. -  Briscoe Deutscher, DO, MS, FAAFP, DABOM - Family and Bariatric Medicine.

## 2021-05-04 DIAGNOSIS — L218 Other seborrheic dermatitis: Secondary | ICD-10-CM | POA: Diagnosis not present

## 2021-05-04 DIAGNOSIS — L245 Irritant contact dermatitis due to other chemical products: Secondary | ICD-10-CM | POA: Diagnosis not present

## 2021-05-05 ENCOUNTER — Other Ambulatory Visit: Payer: BC Managed Care – PPO

## 2021-05-08 ENCOUNTER — Other Ambulatory Visit: Payer: Self-pay

## 2021-05-08 ENCOUNTER — Ambulatory Visit (INDEPENDENT_AMBULATORY_CARE_PROVIDER_SITE_OTHER): Payer: BC Managed Care – PPO

## 2021-05-08 DIAGNOSIS — B351 Tinea unguium: Secondary | ICD-10-CM

## 2021-05-08 NOTE — Progress Notes (Signed)
Patient presents today for the 2nd laser treatment. Diagnosed with mycotic nail infection by Dr. Paulla Dolly.   Toenail most affected bilateral hallux.  All other systems are negative.  Nails were filed thin. Laser therapy was administered to bilateral great toenails  and patient tolerated the treatment well. All safety precautions were in place.    Follow up in 6 weeks for laser # 3.

## 2021-05-11 ENCOUNTER — Encounter: Payer: Self-pay | Admitting: Podiatry

## 2021-05-17 ENCOUNTER — Ambulatory Visit (INDEPENDENT_AMBULATORY_CARE_PROVIDER_SITE_OTHER): Payer: BC Managed Care – PPO | Admitting: Family Medicine

## 2021-05-25 ENCOUNTER — Ambulatory Visit (INDEPENDENT_AMBULATORY_CARE_PROVIDER_SITE_OTHER): Payer: BC Managed Care – PPO | Admitting: Family Medicine

## 2021-06-06 ENCOUNTER — Encounter (INDEPENDENT_AMBULATORY_CARE_PROVIDER_SITE_OTHER): Payer: Self-pay | Admitting: Family Medicine

## 2021-06-06 DIAGNOSIS — R632 Polyphagia: Secondary | ICD-10-CM

## 2021-06-07 ENCOUNTER — Other Ambulatory Visit: Payer: Self-pay

## 2021-06-07 ENCOUNTER — Ambulatory Visit (INDEPENDENT_AMBULATORY_CARE_PROVIDER_SITE_OTHER): Payer: BC Managed Care – PPO

## 2021-06-07 DIAGNOSIS — B351 Tinea unguium: Secondary | ICD-10-CM

## 2021-06-07 MED ORDER — OZEMPIC (1 MG/DOSE) 4 MG/3ML ~~LOC~~ SOPN: 1.0000 mg | PEN_INJECTOR | SUBCUTANEOUS | 0 refills | Status: DC

## 2021-06-07 NOTE — Progress Notes (Signed)
Patient presents today for the 3rd laser treatment. Diagnosed with mycotic nail infection by Dr. Paulla Dolly.  ? ?Toenail most affected bilateral hallux. ? ?All other systems are negative. ? ?Nails were filed thin. Laser therapy was administered to bilateral great toenails  and patient tolerated the treatment well. All safety precautions were in place.  ? ?Patient completed Lamisil pulse dose as prescribed.   ? ? ?Follow up in 6 weeks for laser # 4. ? ?

## 2021-06-14 ENCOUNTER — Ambulatory Visit (INDEPENDENT_AMBULATORY_CARE_PROVIDER_SITE_OTHER): Payer: BC Managed Care – PPO | Admitting: Family Medicine

## 2021-06-14 MED ORDER — OZEMPIC (1 MG/DOSE) 4 MG/3ML ~~LOC~~ SOPN: 1.0000 mg | PEN_INJECTOR | SUBCUTANEOUS | 0 refills | Status: DC

## 2021-06-14 NOTE — Addendum Note (Signed)
Addended byKarren Cobble on: 06/14/2021 10:22 AM ? ? Modules accepted: Orders ? ?

## 2021-06-15 NOTE — Telephone Encounter (Signed)
Prior authorization has been started for Ozempic. Will notify patient and provider once response is received.  ?

## 2021-06-16 ENCOUNTER — Other Ambulatory Visit: Payer: BC Managed Care – PPO

## 2021-06-21 ENCOUNTER — Telehealth (INDEPENDENT_AMBULATORY_CARE_PROVIDER_SITE_OTHER): Payer: Self-pay | Admitting: Family Medicine

## 2021-06-21 ENCOUNTER — Encounter (INDEPENDENT_AMBULATORY_CARE_PROVIDER_SITE_OTHER): Payer: Self-pay

## 2021-06-21 NOTE — Telephone Encounter (Signed)
Prior authorization denied for Ozempic. Per insurance: pt does not have type 2 diabetes. Patient sent denial message via mychart.  ?

## 2021-07-02 IMAGING — MG MM DIGITAL DIAGNOSTIC UNILAT*L* W/ TOMO W/ CAD
6 series · 6 of 18 positions shown · non-contrast
Comparison: Previous exam(s).

CLINICAL DATA: Patient complains of focal pain in the upper-outer
quadrant of the left breast.

EXAM:
DIGITAL DIAGNOSTIC UNILATERAL LEFT MAMMOGRAM WITH TOMOSYNTHESIS AND
CAD; ULTRASOUND LEFT BREAST LIMITED
TECHNIQUE: Left digital diagnostic mammography and breast tomosynthesis was
performed. The images were evaluated with computer-aided detection.;
Targeted ultrasound examination of the left breast was performed

[L TAN synth-2D]
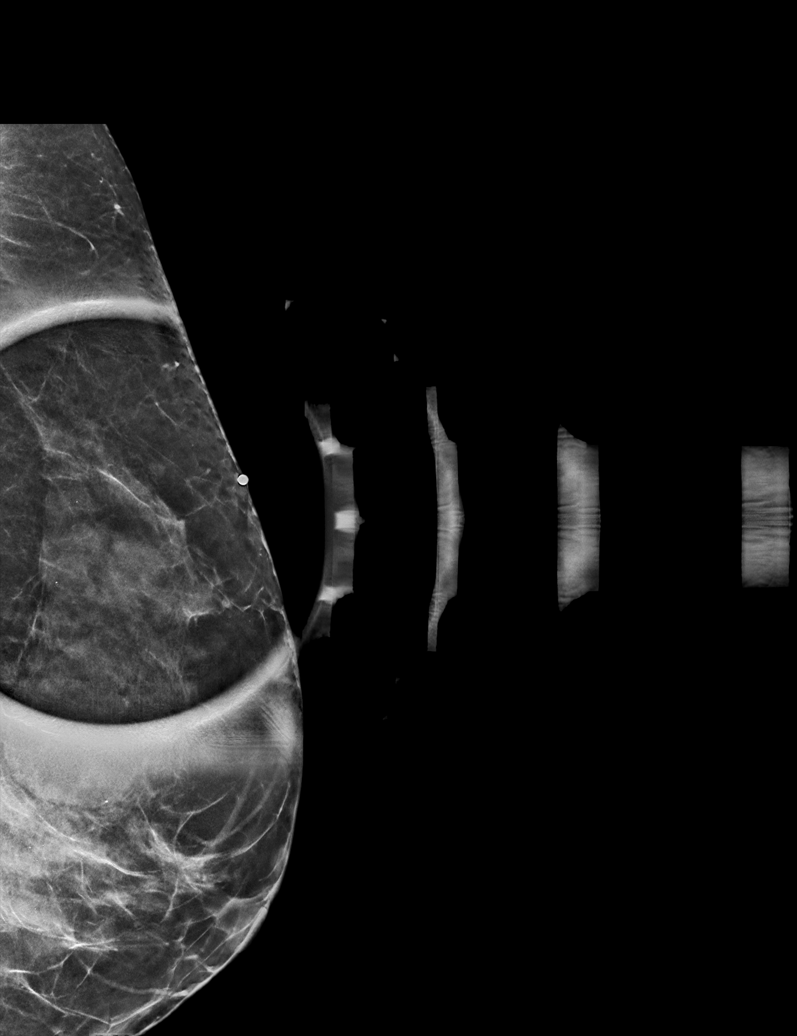

[L MLO synth-2D]
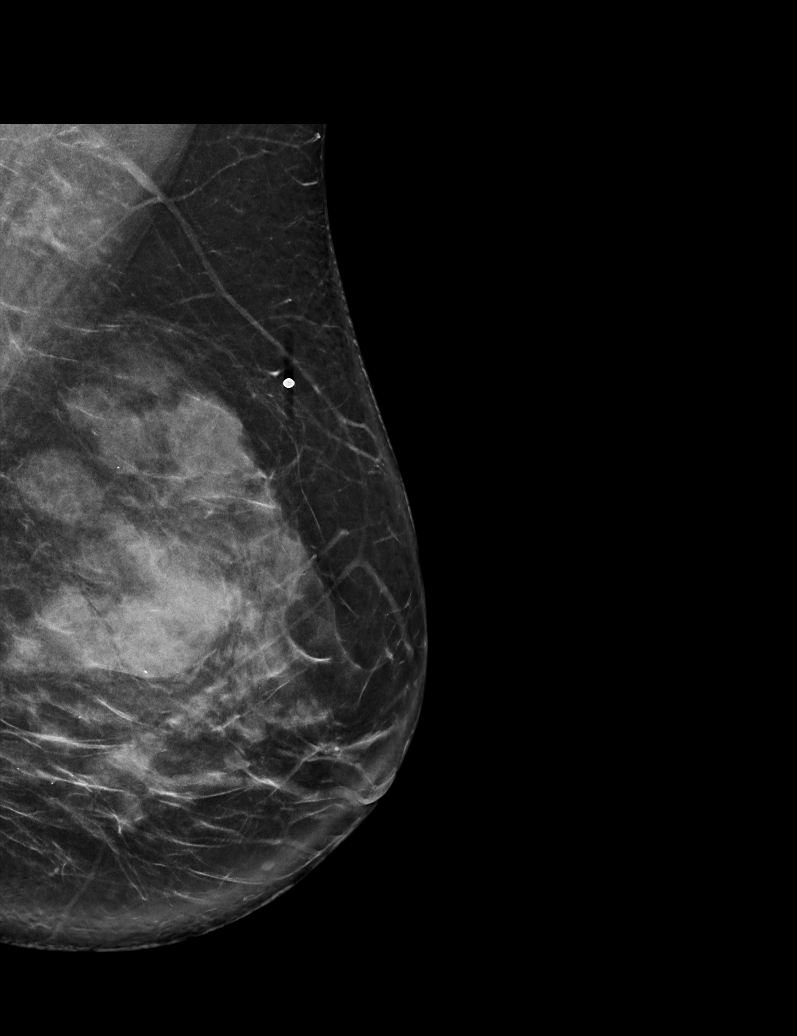

[L CC synth-2D]
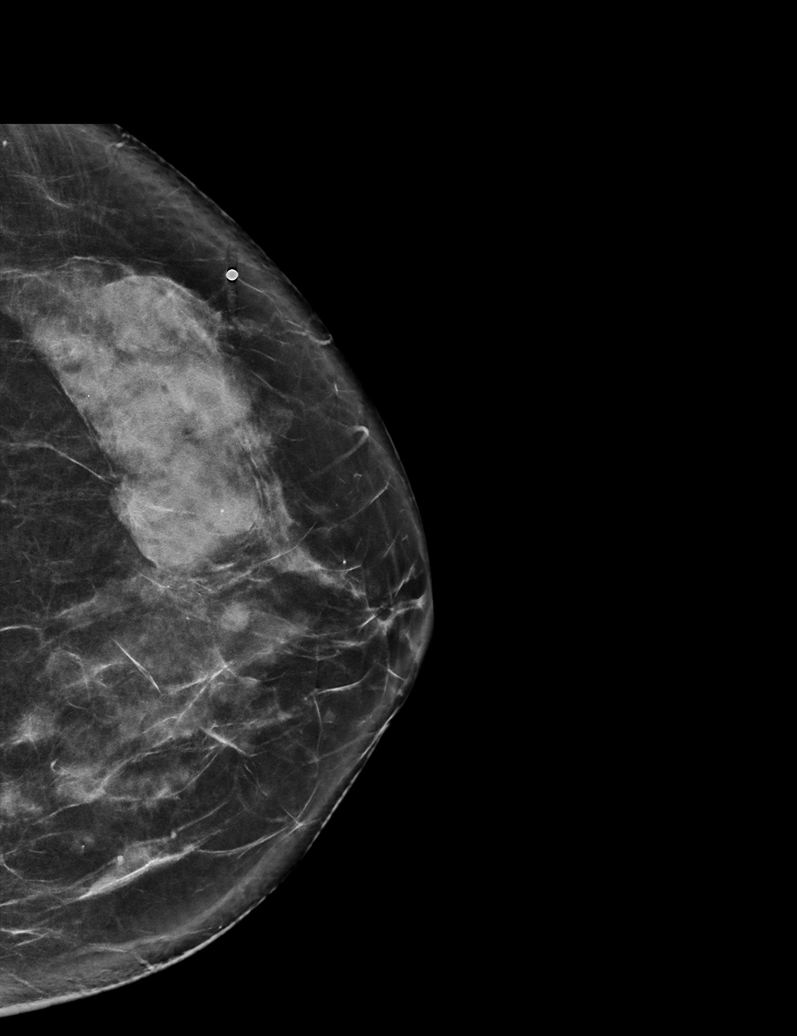

[L MLO tomo · tomo slice 39/77.0]
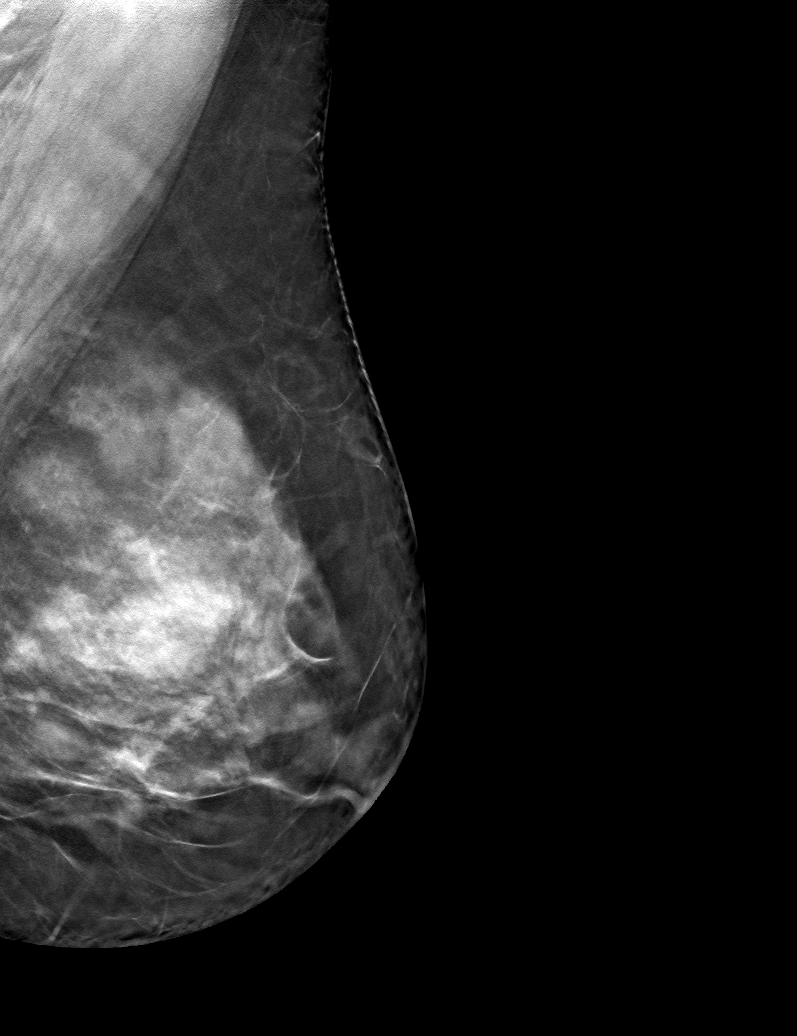

[L CC tomo · tomo slice 36/71.0]
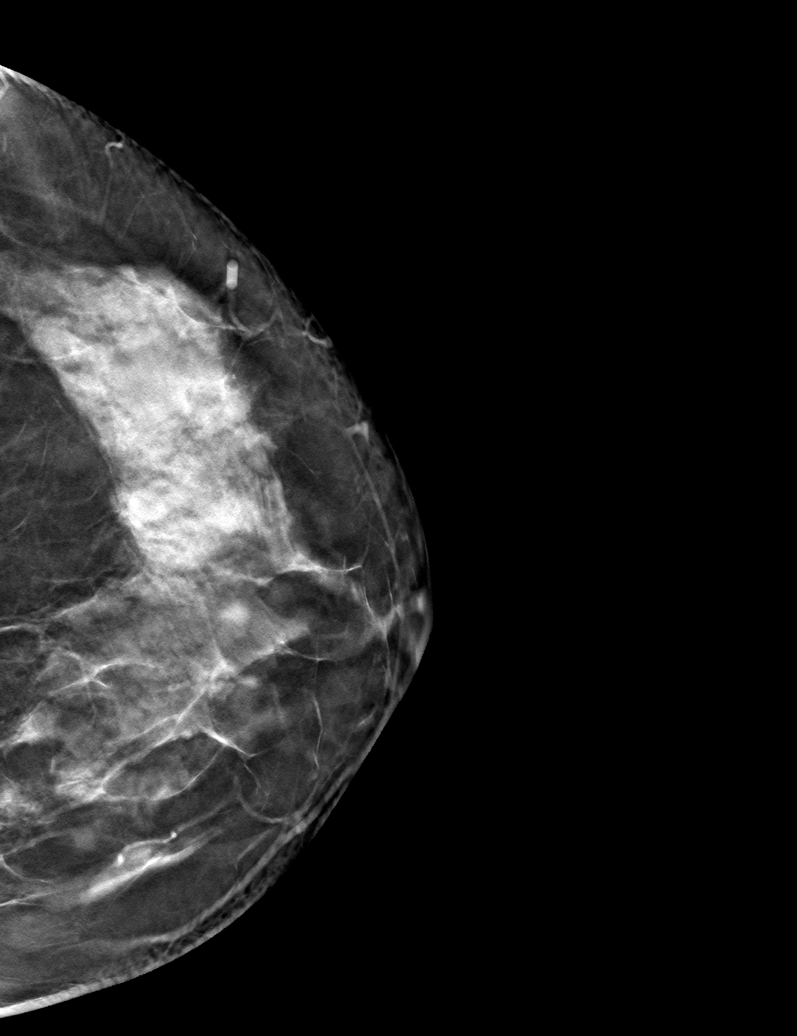

[L TAN tomo · tomo slice 30/59.0]
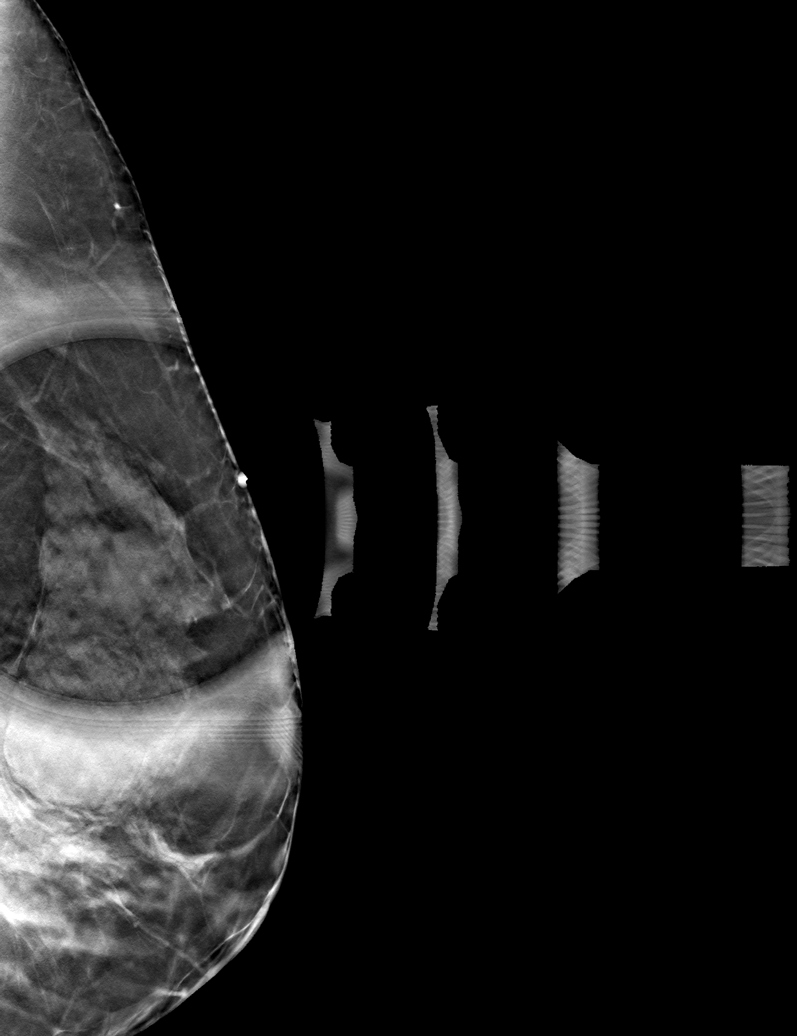

[6 of 18 positions shown; findings below may reference images not displayed]

ACR Breast Density Category c: The breast tissue is heterogeneously
dense, which may obscure small masses.
FINDINGS: Multiple well-circumscribed masses are seen in the upper-outer
quadrant of the left breast. There are no malignant type
microcalcifications.

Targeted ultrasound is performed, showing numerous anechoic cysts in
the upper outer quadrant of the left breast. The largest 2 cysts are
1 o'clock 6 cm from the nipple measuring 1.4 x 1.1 x 1.6 cm and
x 1.2 x 3.8 cm. No solid mass is identified.
IMPRESSION: Left breast cysts.  No evidence of malignancy in the left breast.

RECOMMENDATION:
Bilateral screening mammogram in Monday August, 2020 is recommended.

I have discussed the findings and recommendations with the patient.
If applicable, a reminder letter will be sent to the patient
regarding the next appointment.

BI-RADS CATEGORY  2: Benign.

## 2021-07-28 ENCOUNTER — Other Ambulatory Visit: Payer: BC Managed Care – PPO

## 2021-08-03 DIAGNOSIS — F3289 Other specified depressive episodes: Secondary | ICD-10-CM | POA: Diagnosis not present

## 2021-08-03 DIAGNOSIS — R7309 Other abnormal glucose: Secondary | ICD-10-CM | POA: Diagnosis not present

## 2021-08-03 DIAGNOSIS — G4700 Insomnia, unspecified: Secondary | ICD-10-CM | POA: Diagnosis not present

## 2021-08-03 DIAGNOSIS — Z9189 Other specified personal risk factors, not elsewhere classified: Secondary | ICD-10-CM | POA: Diagnosis not present

## 2021-08-03 DIAGNOSIS — E039 Hypothyroidism, unspecified: Secondary | ICD-10-CM | POA: Diagnosis not present

## 2021-08-15 ENCOUNTER — Ambulatory Visit (INDEPENDENT_AMBULATORY_CARE_PROVIDER_SITE_OTHER): Payer: BC Managed Care – PPO

## 2021-08-15 DIAGNOSIS — B351 Tinea unguium: Secondary | ICD-10-CM

## 2021-08-15 NOTE — Progress Notes (Unsigned)
Patient presents today for the 4th laser treatment. Diagnosed with mycotic nail infection by Dr. Paulla Dolly.   Toenail most affected bilateral hallux.  All other systems are negative.  Nails were filed thin. Laser therapy was administered to bilateral great toenails  and patient tolerated the treatment well. All safety precautions were in place.   Patient completed Lamisil pulse dose as prescribed.    Patient has completed the recommended laser treatments. He will follow up with Dr. Paulla Dolly in 3 months to evaluate progress.

## 2021-09-06 DIAGNOSIS — R632 Polyphagia: Secondary | ICD-10-CM | POA: Diagnosis not present

## 2021-09-06 DIAGNOSIS — E663 Overweight: Secondary | ICD-10-CM | POA: Diagnosis not present

## 2021-09-28 DIAGNOSIS — I83813 Varicose veins of bilateral lower extremities with pain: Secondary | ICD-10-CM | POA: Diagnosis not present

## 2021-10-25 ENCOUNTER — Encounter (INDEPENDENT_AMBULATORY_CARE_PROVIDER_SITE_OTHER): Payer: Self-pay

## 2021-10-25 DIAGNOSIS — R632 Polyphagia: Secondary | ICD-10-CM | POA: Diagnosis not present

## 2021-10-25 DIAGNOSIS — G4700 Insomnia, unspecified: Secondary | ICD-10-CM | POA: Diagnosis not present

## 2021-10-25 DIAGNOSIS — E039 Hypothyroidism, unspecified: Secondary | ICD-10-CM | POA: Diagnosis not present

## 2021-10-25 DIAGNOSIS — Z8639 Personal history of other endocrine, nutritional and metabolic disease: Secondary | ICD-10-CM | POA: Diagnosis not present

## 2021-11-09 DIAGNOSIS — D2272 Melanocytic nevi of left lower limb, including hip: Secondary | ICD-10-CM | POA: Diagnosis not present

## 2021-11-09 DIAGNOSIS — L218 Other seborrheic dermatitis: Secondary | ICD-10-CM | POA: Diagnosis not present

## 2021-11-09 DIAGNOSIS — L815 Leukoderma, not elsewhere classified: Secondary | ICD-10-CM | POA: Diagnosis not present

## 2021-11-09 DIAGNOSIS — D1801 Hemangioma of skin and subcutaneous tissue: Secondary | ICD-10-CM | POA: Diagnosis not present

## 2021-11-09 DIAGNOSIS — L858 Other specified epidermal thickening: Secondary | ICD-10-CM | POA: Diagnosis not present

## 2021-11-15 ENCOUNTER — Ambulatory Visit: Payer: BC Managed Care – PPO | Admitting: Podiatry

## 2021-11-22 DIAGNOSIS — F3289 Other specified depressive episodes: Secondary | ICD-10-CM | POA: Diagnosis not present

## 2021-11-22 DIAGNOSIS — R632 Polyphagia: Secondary | ICD-10-CM | POA: Diagnosis not present

## 2021-11-22 DIAGNOSIS — E039 Hypothyroidism, unspecified: Secondary | ICD-10-CM | POA: Diagnosis not present

## 2021-11-30 ENCOUNTER — Ambulatory Visit: Payer: BC Managed Care – PPO | Admitting: Podiatry

## 2021-11-30 ENCOUNTER — Encounter: Payer: Self-pay | Admitting: Podiatry

## 2021-11-30 DIAGNOSIS — B351 Tinea unguium: Secondary | ICD-10-CM | POA: Diagnosis not present

## 2021-11-30 NOTE — Progress Notes (Signed)
Subjective:   Patient ID: Jane Avila, female   DOB: 51 y.o.   MRN: [id number]   HPI Patient presents with concern about nail disease stating that she just had her polish taken off and she notices a small amount of yellowness at the end of the nailbeds right and left   ROS      Objective:  Physical Exam  Neurovascular status intact muscle strength adequate range of motion adequate with patient found to have discoloration pattern distal lateral aspect of the hallux bilateral but appears to be more due to trauma and the possibility that polish is occluding the nailbed     Assessment:  Difficult to say between fungal infection versus trauma versus nail occlusion from polish     Plan:  H&P reviewed and I discussed the consideration for further treatments and organ to defer at this point and if it were to spread and she will take pictures every 2 months then we will go back to a pulse Lamisil along with laser.  All education given to patient

## 2021-12-21 DIAGNOSIS — R632 Polyphagia: Secondary | ICD-10-CM | POA: Diagnosis not present

## 2021-12-27 DIAGNOSIS — E038 Other specified hypothyroidism: Secondary | ICD-10-CM | POA: Diagnosis not present

## 2021-12-27 DIAGNOSIS — Z79890 Hormone replacement therapy (postmenopausal): Secondary | ICD-10-CM | POA: Diagnosis not present

## 2021-12-27 DIAGNOSIS — E063 Autoimmune thyroiditis: Secondary | ICD-10-CM | POA: Diagnosis not present

## 2022-01-03 ENCOUNTER — Telehealth: Payer: Self-pay | Admitting: Internal Medicine

## 2022-01-03 NOTE — Telephone Encounter (Signed)
Jane Avila [phone]  Lareen called to say she is having weekly headaches and would like to be seen, she is not available to come in until next week.

## 2022-01-10 DIAGNOSIS — Z90711 Acquired absence of uterus with remaining cervical stump: Secondary | ICD-10-CM | POA: Diagnosis not present

## 2022-01-10 DIAGNOSIS — Z6828 Body mass index (BMI) 28.0-28.9, adult: Secondary | ICD-10-CM | POA: Diagnosis not present

## 2022-01-10 DIAGNOSIS — Z1231 Encounter for screening mammogram for malignant neoplasm of breast: Secondary | ICD-10-CM | POA: Diagnosis not present

## 2022-01-10 DIAGNOSIS — Z01419 Encounter for gynecological examination (general) (routine) without abnormal findings: Secondary | ICD-10-CM | POA: Diagnosis not present

## 2022-01-10 LAB — HM MAMMOGRAPHY

## 2022-01-11 ENCOUNTER — Ambulatory Visit: Payer: BC Managed Care – PPO | Admitting: Internal Medicine

## 2022-01-11 ENCOUNTER — Encounter: Payer: Self-pay | Admitting: Internal Medicine

## 2022-01-11 VITALS — BP 114/76 | HR 76 | Temp 98.2°F | Ht 64.0 in | Wt 163.8 lb

## 2022-01-11 DIAGNOSIS — E039 Hypothyroidism, unspecified: Secondary | ICD-10-CM | POA: Diagnosis not present

## 2022-01-11 DIAGNOSIS — M79605 Pain in left leg: Secondary | ICD-10-CM | POA: Diagnosis not present

## 2022-01-11 DIAGNOSIS — G43011 Migraine without aura, intractable, with status migrainosus: Secondary | ICD-10-CM | POA: Diagnosis not present

## 2022-01-11 DIAGNOSIS — Z6828 Body mass index (BMI) 28.0-28.9, adult: Secondary | ICD-10-CM | POA: Diagnosis not present

## 2022-01-11 DIAGNOSIS — M79604 Pain in right leg: Secondary | ICD-10-CM | POA: Diagnosis not present

## 2022-01-11 MED ORDER — ONDANSETRON 4 MG PO TBDP: 4.0000 mg | ORAL_TABLET | Freq: Three times a day (TID) | ORAL | 0 refills | Status: DC | PRN

## 2022-01-11 MED ORDER — RIZATRIPTAN BENZOATE 10 MG PO TABS: 10.0000 mg | ORAL_TABLET | ORAL | 1 refills | Status: DC | PRN

## 2022-01-11 NOTE — Progress Notes (Signed)
Subjective:    Patient ID: Jane Avila, female    DOB: May 18, 1970, 51 y.o.   MRN: [id number]  HPI 51 year old Female with history of hypothyroidism and hypertension.  She had postpartum thyroiditis in 2005 with anti-TPO antibody strongly positive.  Subsequently TSH increased to 27.171 by September 2005 and she was started on thyroid replacement.  She attends [facility].  In 2017 she weighed 190 pounds.  In 2019 173 pounds 9 ounces.  In August 2020 she weighed 190 pounds.  In May 2021 she weighed 184 pounds.  In March 2022 she weighed 166 pounds.  In February 2023 she weighed 159 pounds.  History of mild glucose intolerance with hemoglobin A1c 5.7% in 2021.  2 years ago vitamin D level was 37.3.  For hypothyroidism she is followed by [facility].  TSH and free T4 were normal on October 11.  These were done through endocrinology.  History of hypothyroidism treated with levothyroxine 0.88 mg daily.  Takes Vistaril [facility] Dr. Juleen China at [facility] as well as Wellbutrin.  She has been having weekly headaches.  These headaches are inconvenient and affect her work schedule.  Has not been able to get much relief.  She is not sure what the triggers are.  We have gone over migraine headache triggers and she cannot pinpoint anything in particular.  She is concerned about these.  Headache is throbbing and pounding.  At times is nauseated.  She does not have anything prescription medication wise to take.  She is on trazodone per Dr. Juleen China at [facility] and also Wellbutrin.  Recently was tried on some phentermine.  This may have aggravated her headaches.  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  Has worked in Building services engineer.  She is married.  Has 3 children.  Does not smoke.  Rare alcohol consumption.  Family  history: Father with history of hypertension, diabetes and skin cancer.  Mother with history of hypertension.  Review of Systems mammogram ordered today.  In December 2022 prior authorization for Ozempic was denied.  Was denied again in April 2023.  Last contact with this office was in 2021     Objective:   Physical Exam Blood sugar 114/76 pulse 76 temperature 98.2 degrees pulse oximetry 99% weight 163 pounds 12.8 ounces height 5 feet 4 inches BMI 28.12 No thyromegaly or carotid bruits.  Extraocular movements are full.  PERRLA.  No facial asymmetry.  Cranial nerves II through XII are grossly intact.  Chest clear.  Cardiac exam: Regular rate and rhythm without ectopy.  Brief neurological exam is intact without gross focal deficits.      Assessment & Plan:  BMI 28-currently seen by Dr. Juleen China at weight loss clinic.  They have tried to get authorization for Ozempic and that has been denied.  Currently on Wellbutrin and trazodone per [facility].  History of hypothyroidism seen by Endocrinologist at [facility] and currently on levothyroxine 0.088 mg daily  History of cyst left breasts diagnosed in 2020  Migraine headaches-these seem to be more frequent recently.  She is not quite clear what triggers are other than situational stress and perhaps some sleep deprivation.  It would be wise to keep a headache diary.  We are going to try Maxalt and Zofran to take immediately at onset of migraine.  We are  happy to send her to [facility] for evaluation if necessary.

## 2022-01-12 ENCOUNTER — Encounter: Payer: Self-pay | Admitting: Internal Medicine

## 2022-01-13 NOTE — Patient Instructions (Addendum)
Have prescribed Maxalt and Zofran for migraine headaches.  Please take both at onset of migraine.  We are happy to refer her to Neurology for evaluation.  Health maintenance exam has been booked for February 2024.  Hopefully trazodone will help with migraine prevention.  Keep headache diary.  See if certain foods or sleep deprivation are contributing to migraines.  It was a pleasure to see you again.

## 2022-01-17 DIAGNOSIS — E663 Overweight: Secondary | ICD-10-CM | POA: Diagnosis not present

## 2022-01-17 DIAGNOSIS — Z8639 Personal history of other endocrine, nutritional and metabolic disease: Secondary | ICD-10-CM | POA: Diagnosis not present

## 2022-01-17 DIAGNOSIS — R632 Polyphagia: Secondary | ICD-10-CM | POA: Diagnosis not present

## 2022-01-18 ENCOUNTER — Other Ambulatory Visit: Payer: BC Managed Care – PPO

## 2022-01-18 DIAGNOSIS — G43811 Other migraine, intractable, with status migrainosus: Secondary | ICD-10-CM | POA: Diagnosis not present

## 2022-01-18 DIAGNOSIS — R5383 Other fatigue: Secondary | ICD-10-CM | POA: Diagnosis not present

## 2022-01-18 NOTE — Addendum Note (Signed)
Addended by: Geradine Girt D on: 01/18/2022 09:47 AM   Modules accepted: Orders

## 2022-01-18 NOTE — Addendum Note (Signed)
Addended by: Geradine Girt D on: 01/18/2022 09:21 AM   Modules accepted: Orders

## 2022-01-19 LAB — COMPLETE METABOLIC PANEL WITH GFR
AG Ratio: 1.9 (calc) (ref 1.0–2.5)
ALT: 18 U/L (ref 6–29)
AST: 19 U/L (ref 10–35)
Albumin: 4.2 g/dL (ref 3.6–5.1)
Alkaline phosphatase (APISO): 56 U/L (ref 37–153)
BUN: 13 mg/dL (ref 7–25)
CO2: 30 mmol/L (ref 20–32)
Calcium: 9.2 mg/dL (ref 8.6–10.4)
Chloride: 104 mmol/L (ref 98–110)
Creat: 0.74 mg/dL (ref 0.50–1.03)
Globulin: 2.2 g/dL (calc) (ref 1.9–3.7)
Glucose, Bld: 76 mg/dL (ref 65–99)
Potassium: 4.6 mmol/L (ref 3.5–5.3)
Sodium: 141 mmol/L (ref 135–146)
Total Bilirubin: 0.5 mg/dL (ref 0.2–1.2)
Total Protein: 6.4 g/dL (ref 6.1–8.1)
eGFR: 99 mL/min/{1.73_m2} (ref >=60)

## 2022-01-19 LAB — CBC WITH DIFFERENTIAL/PLATELET
Absolute Monocytes: 603 cells/uL (ref 200–950)
Basophils Absolute: 58 cells/uL (ref 0–200)
Basophils Relative: 1 %
Eosinophils Absolute: 162 cells/uL (ref 15–500)
Eosinophils Relative: 2.8 %
HCT: 41.3 % (ref 35.0–45.0)
Hemoglobin: 13.8 g/dL (ref 11.7–15.5)
Lymphs Abs: 1902 cells/uL (ref 850–3900)
MCH: 29.7 pg (ref 27.0–33.0)
MCHC: 33.4 g/dL (ref 32.0–36.0)
MCV: 89 fL (ref 80.0–100.0)
MPV: 9.5 fL (ref 7.5–12.5)
Monocytes Relative: 10.4 %
Neutro Abs: 3074 cells/uL (ref 1500–7800)
Neutrophils Relative %: 53 %
Platelets: 342 10*3/uL (ref 140–400)
RBC: 4.64 10*6/uL (ref 3.80–5.10)
RDW: 12.6 % (ref 11.0–15.0)
Total Lymphocyte: 32.8 %
WBC: 5.8 10*3/uL (ref 3.8–10.8)

## 2022-01-19 LAB — SEDIMENTATION RATE: Sed Rate: 6 mm/h (ref 0–20)

## 2022-02-14 DIAGNOSIS — R030 Elevated blood-pressure reading, without diagnosis of hypertension: Secondary | ICD-10-CM | POA: Diagnosis not present

## 2022-02-14 DIAGNOSIS — Z8639 Personal history of other endocrine, nutritional and metabolic disease: Secondary | ICD-10-CM | POA: Diagnosis not present

## 2022-02-14 DIAGNOSIS — R7301 Impaired fasting glucose: Secondary | ICD-10-CM | POA: Diagnosis not present

## 2022-02-15 ENCOUNTER — Other Ambulatory Visit (HOSPITAL_COMMUNITY): Payer: Self-pay

## 2022-02-15 MED ORDER — MOUNJARO 5 MG/0.5ML ~~LOC~~ SOAJ
5.0000 mg | SUBCUTANEOUS | 0 refills | Status: DC
  Filled 2022-02-15: qty 2, 28d supply, fill #0

## 2022-02-17 ENCOUNTER — Other Ambulatory Visit (HOSPITAL_COMMUNITY): Payer: Self-pay

## 2022-02-22 DIAGNOSIS — K5900 Constipation, unspecified: Secondary | ICD-10-CM | POA: Diagnosis not present

## 2022-02-28 ENCOUNTER — Ambulatory Visit: Payer: BC Managed Care – PPO | Admitting: Neurology

## 2022-02-28 ENCOUNTER — Encounter: Payer: Self-pay | Admitting: Neurology

## 2022-02-28 VITALS — BP 145/89 | HR 65 | Ht 64.0 in | Wt 160.8 lb

## 2022-02-28 DIAGNOSIS — H539 Unspecified visual disturbance: Secondary | ICD-10-CM | POA: Diagnosis not present

## 2022-02-28 DIAGNOSIS — G43009 Migraine without aura, not intractable, without status migrainosus: Secondary | ICD-10-CM | POA: Insufficient documentation

## 2022-02-28 DIAGNOSIS — R51 Headache with orthostatic component, not elsewhere classified: Secondary | ICD-10-CM

## 2022-02-28 DIAGNOSIS — H4710 Unspecified papilledema: Secondary | ICD-10-CM

## 2022-02-28 DIAGNOSIS — R519 Headache, unspecified: Secondary | ICD-10-CM | POA: Diagnosis not present

## 2022-02-28 DIAGNOSIS — H5460 Unqualified visual loss, one eye, unspecified: Secondary | ICD-10-CM

## 2022-02-28 MED ORDER — UBRELVY 100 MG PO TABS: 100.0000 mg | ORAL_TABLET | ORAL | 0 refills | Status: DC | PRN

## 2022-02-28 MED ORDER — AMITRIPTYLINE HCL 25 MG PO TABS: 25.0000 mg | ORAL_TABLET | Freq: Every day | ORAL | 3 refills | Status: DC

## 2022-02-28 MED ORDER — NURTEC 75 MG PO TBDP: 75.0000 mg | ORAL_TABLET | Freq: Every day | ORAL | 0 refills | Status: DC | PRN

## 2022-02-28 NOTE — Patient Instructions (Addendum)
- Need a recent eye exam: Dr. Katy Fitch - MRI of the brain w/wo contrast - As needed/emergent: At onset of headache: Rizatriptan for migraine and ondansetron for nausea, can take them together and then repeat in 2 hours if needed. Other medications could consider are the new medications Nurtec, Ubrelvy - Prevention: since episodic, 2 a month wouldn't start preventative but low threshold if they get worse - she has tried topiramate, will start amitriptyline for insomnia and migraines may help with both, if blood pressure is elevated(take readings at home) could try propranolol as well and then go to the newer medications, discussed the new class of medications such as qulipta, Ajovy and Emgality,  - blood pressure slightly elevated today, monitor at home several times a day - Trazodone not working not taking, can try amitriptyline can help with migraines and sleep - samples ubrelvy: Please take one tablet at the onset of your headache. If it does not improve the symptoms please take one additional tablet. Do not take more then 2 tablets in 24hrs. Do not take use more then 2 to 3 times in a week. -samples nurtec once daily as needed  Ubrogepant Tablets What is this medication? UBROGEPANT (ue BROE je pant) treats migraines. It works by blocking a substance in the body that causes migraines. It is not used to prevent migraines. This medicine may be used for other purposes; ask your health care provider or pharmacist if you have questions. COMMON BRAND NAME(S): Roselyn Meier What should I tell my care team before I take this medication? They need to know if you have any of these conditions: Kidney disease Liver disease An unusual or allergic reaction to ubrogepant, other medications, foods, dyes, or preservatives Pregnant or trying to get pregnant Breast-feeding How should I use this medication? Take this medication by mouth with a glass of water. Take it as directed on the prescription label. You can take it  with or without food. If it upsets your stomach, take it with food. Keep taking it unless your care team tells you to stop. Talk to your care team about the use of this medication in children. Special care may be needed. Overdosage: If you think you have taken too much of this medicine contact a poison control center or emergency room at once. NOTE: This medicine is only for you. Do not share this medicine with others. What if I miss a dose? This does not apply. This medication is not for regular use. What may interact with this medication? Do not take this medication with any of the following: Adagrasib Ceritinib Certain antibiotics, such as chloramphenicol, clarithromycin, telithromycin Certain antivirals for HIV, such as atazanavir, cobicistat, darunavir, delavirdine, fosamprenavir, indinavir, ritonavir Certain medications for fungal infections, such as itraconazole, ketoconazole, posaconazole, voriconazole Conivaptan Grapefruit Idelalisib Mifepristone Nefazodone Ribociclib This medication may also interact with the following: Carvedilol Certain medications for seizures, such as phenobarbital, phenytoin Ciprofloxacin Cyclosporine Eltrombopag Fluconazole Fluvoxamine Quinidine Rifampin St. John's wort Verapamil This list may not describe all possible interactions. Give your health care provider a list of all the medicines, herbs, non-prescription drugs, or dietary supplements you use. Also tell them if you smoke, drink alcohol, or use illegal drugs. Some items may interact with your medicine. What should I watch for while using this medication? Visit your care team for regular checks on your progress. Tell your care team if your symptoms do not start to get better or if they get worse. Your mouth may get dry. Chewing sugarless gum or  sucking hard candy and drinking plenty of water may help. Contact your care team if the problem does not go away or is severe. What side effects may  I notice from receiving this medication? Side effects that you should report to your care team as soon as possible: Allergic reactions--skin rash, itching, hives, swelling of the face, lips, tongue, or throat Side effects that usually do not require medical attention (report to your care team if they continue or are bothersome): Drowsiness Dry mouth Fatigue Nausea This list may not describe all possible side effects. Call your doctor for medical advice about side effects. You may report side effects to FDA at 1-800-FDA-1088. Where should I keep my medication? Keep out of the reach of children and pets. Store between 15 and 30 degrees C (59 and 86 degrees F). Get rid of any unused medication after the expiration date. To get rid of medications that are no longer needed or have expired: Take the medication to a medication take-back program. Check with your pharmacy or law enforcement to find a location. If you cannot return the medication, check the label or package insert to see if the medication should be thrown out in the garbage or flushed down the toilet. If you are not sure, ask your care team. If it is safe to put it in the trash, pour the medication out of the container. Mix the medication with cat litter, dirt, coffee grounds, or other unwanted substance. Seal the mixture in a bag or container. Put it in the trash. NOTE: This sheet is a summary. It may not cover all possible information. If you have questions about this medicine, talk to your doctor, pharmacist, or health care provider.  2023 Elsevier/Gold Standard (2021-04-26 00:00:00)   Rimegepant Disintegrating Tablets What is this medication? RIMEGEPANT (ri ME je pant) prevents and treats migraines. It works by blocking a substance in the body that causes migraines. This medicine may be used for other purposes; ask your health care provider or pharmacist if you have questions. COMMON BRAND NAME(S): NURTEC ODT What should I tell my  care team before I take this medication? They need to know if you have any of these conditions: Kidney disease Liver disease An unusual or allergic reaction to rimegepant, other medications, foods, dyes, or preservatives Pregnant or trying to get pregnant Breast-feeding How should I use this medication? Take this medication by mouth. Take it as directed on the prescription label. Leave the tablet in the sealed pack until you are ready to take it. With dry hands, open the pack and gently remove the tablet. If the tablet breaks or crumbles, throw it away. Use a new tablet. Place the tablet in the mouth and allow it to dissolve. Then, swallow it. Do not cut, crush, or chew this medication. You do not need water to take this medication. Talk to your care team about the use of this medication in children. Special care may be needed. Overdosage: If you think you have taken too much of this medicine contact a poison control center or emergency room at once. NOTE: This medicine is only for you. Do not share this medicine with others. What if I miss a dose? This does not apply. This medication is not for regular use. What may interact with this medication? Certain medications for fungal infections, such as fluconazole, itraconazole Rifampin This list may not describe all possible interactions. Give your health care provider a list of all the medicines, herbs, non-prescription drugs, or dietary  supplements you use. Also tell them if you smoke, drink alcohol, or use illegal drugs. Some items may interact with your medicine. What should I watch for while using this medication? Visit your care team for regular checks on your progress. Tell your care team if your symptoms do not start to get better or if they get worse. What side effects may I notice from receiving this medication? Side effects that you should report to your care team as soon as possible: Allergic reactions--skin rash, itching, hives,  swelling of the face, lips, tongue, or throat Side effects that usually do not require medical attention (report to your care team if they continue or are bothersome): Nausea Stomach pain This list may not describe all possible side effects. Call your doctor for medical advice about side effects. You may report side effects to FDA at 1-800-FDA-1088. Where should I keep my medication? Keep out of the reach of children and pets. Store at room temperature between 20 and 25 degrees C (68 and 77 degrees F). Get rid of any unused medication after the expiration date. To get rid of medications that are no longer needed or have expired: Take the medication to a medication take-back program. Check with your pharmacy or law enforcement to find a location. If you cannot return the medication, check the label or package insert to see if the medication should be thrown out in the garbage or flushed down the toilet. If you are not sure, ask your care team. If it is safe to put it in the trash, take the medication out of the container. Mix the medication with cat litter, dirt, coffee grounds, or other unwanted substance. Seal the mixture in a bag or container. Put it in the trash. NOTE: This sheet is a summary. It may not cover all possible information. If you have questions about this medicine, talk to your doctor, pharmacist, or health care provider.  2023 Elsevier/Gold Standard (2021-03-23 00:00:00)  Migraine Headache A migraine headache is an intense, throbbing pain on one side or both sides of the head. Migraine headaches may also cause other symptoms, such as nausea, vomiting, and sensitivity to light and noise. A migraine headache can last from 4 hours to 3 days. Talk with your doctor about what things may bring on (trigger) your migraine headaches. What are the causes? The exact cause of this condition is not known. However, a migraine may be caused when nerves in the brain become irritated and release  chemicals that cause inflammation of blood vessels. This inflammation causes pain. This condition may be triggered or caused by: Drinking alcohol. Smoking. Taking medicines, such as: Medicine used to treat chest pain (nitroglycerin). Birth control pills. Estrogen. Certain blood pressure medicines. Eating or drinking products that contain nitrates, glutamate, aspartame, or tyramine. Aged cheeses, chocolate, or caffeine may also be triggers. Doing physical activity. Other things that may trigger a migraine headache include: Menstruation. Pregnancy. Hunger. Stress. Lack of sleep or too much sleep. Weather changes. Fatigue. What increases the risk? The following factors may make you more likely to experience migraine headaches: Being a certain age. This condition is more common in people who are 77-65 years old. Being female. Having a family history of migraine headaches. Being Caucasian. Having a mental health condition, such as depression or anxiety. Being obese. What are the signs or symptoms? The main symptom of this condition is pulsating or throbbing pain. This pain may: Happen in any area of the head, such as on one side  or both sides. Interfere with daily activities. Get worse with physical activity. Get worse with exposure to bright lights or loud noises. Other symptoms may include: Nausea. Vomiting. Dizziness. General sensitivity to bright lights, loud noises, or smells. Before you get a migraine headache, you may get warning signs (an aura). An aura may include: Seeing flashing lights or having blind spots. Seeing bright spots, halos, or zigzag lines. Having tunnel vision or blurred vision. Having numbness or a tingling feeling. Having trouble talking. Having muscle weakness. Some people have symptoms after a migraine headache (postdromal phase), such as: Feeling tired. Difficulty concentrating. How is this diagnosed? A migraine headache can be diagnosed based  on: Your symptoms. A physical exam. Tests, such as: CT scan or an MRI of the head. These imaging tests can help rule out other causes of headaches. Taking fluid from the spine (lumbar puncture) and analyzing it (cerebrospinal fluid analysis, or CSF analysis). How is this treated? This condition may be treated with medicines that: Relieve pain. Relieve nausea. Prevent migraine headaches. Treatment for this condition may also include: Acupuncture. Lifestyle changes like avoiding foods that trigger migraine headaches. Biofeedback. Cognitive behavioral therapy. Follow these instructions at home: Medicines Take over-the-counter and prescription medicines only as told by your health care provider. Ask your health care provider if the medicine prescribed to you: Requires you to avoid driving or using heavy machinery. Can cause constipation. You may need to take these actions to prevent or treat constipation: Drink enough fluid to keep your urine pale yellow. Take over-the-counter or prescription medicines. Eat foods that are high in fiber, such as beans, whole grains, and fresh fruits and vegetables. Limit foods that are high in fat and processed sugars, such as fried or sweet foods. Lifestyle Do not drink alcohol. Do not use any products that contain nicotine or tobacco, such as cigarettes, e-cigarettes, and chewing tobacco. If you need help quitting, ask your health care provider. Get at least 8 hours of sleep every night. Find ways to manage stress, such as meditation, deep breathing, or yoga. General instructions Keep a journal to find out what may trigger your migraine headaches. For example, write down: What you eat and drink. How much sleep you get. Any change to your diet or medicines. If you have a migraine headache: Avoid things that make your symptoms worse, such as bright lights. It may help to lie down in a dark, quiet room. Do not drive or use heavy machinery. Ask your  health care provider what activities are safe for you while you are experiencing symptoms. Keep all follow-up visits as told by your health care provider. This is important. Contact a health care provider if: You develop symptoms that are different or more severe than your usual migraine headache symptoms. You have more than 15 headache days in one month. Get help right away if: Your migraine headache becomes severe. Your migraine headache lasts longer than 72 hours. You have a fever. You have a stiff neck. You have vision loss. Your muscles feel weak or like you cannot control them. You start to lose your balance often. You have trouble walking. You faint. You have a seizure. Summary A migraine headache is an intense, throbbing pain on one side or both sides of the head. Migraines may also cause other symptoms, such as nausea, vomiting, and sensitivity to light and noise. This condition may be treated with medicines and lifestyle changes. You may also need to avoid certain things that trigger a migraine headache.  Keep a journal to find out what may trigger your migraine headaches. Contact your health care provider if you have more than 15 headache days in a month or you develop symptoms that are different or more severe than your usual migraine headache symptoms. This information is not intended to replace advice given to you by your health care provider. Make sure you discuss any questions you have with your health care provider. Document Revised: 08/17/2021 Document Reviewed: 04/17/2018 Elsevier Patient Education  [place].

## 2022-02-28 NOTE — Progress Notes (Signed)
[facility]    Provider:  Dr Jaynee Eagles Requesting Provider: Renold Genta Cresenciano Lick, MD Primary Care Provider:  Elby Showers, MD  CC:  migraines  HPI:  Jane Avila is a 51 y.o. female here as requested by Elby Showers, MD for headache.  She has a past medical history of polyphasia, class I obesity, generalized anxiety disorder, insomnia disorder, essential hypertension, vitamin D deficiency, migraine headaches, postpartum thyroiditis, ADHD without mention of hyperactivity, hypothyroidism and goiter.  I reviewed Dr. Verlene Mayer notes, patient has migraine headaches, they seem to be more frequent recently not quite sure what triggers are other than situational stress and perhaps some sleep deprivation.  She was advised to keep a headache diary.  She was prescribed Maxalt and Zofran to take immediately at onset of migraine.  She was sent here for evaluation.  I do not see any brain imaging or any prior neurology visits in epic or "Care Everywhere". Per patient has headaches with her menstrual cycle and pregnancy, the migraines started again in the last 2 months, has had them 3-4 times since then, location is in the back of the head and temporal feels vision is affected when the headache happens, has nausea as well.  GYN workup was negative.  She has had migraines since a child, intermittent hormonal related, 19 years ago during pregnancy she had them badly, she gave birth and never had them, she has had 4-5 total in the last few months so an average of 2 a month, nothing has changed, she is not perimenopausal. They are different than before, now they are more debilitating, unilateral either side, has had vision changes of the right eye blurriness just in one eye and bad light sensitivity, also left side and occipital which she never had, never had vision changes. Throbbing/pounding, more severe than before though she may need to go to the ED, gets better with rest and nausea  medication, she has not tried the rizatriptan, nausea, photophobia, hurts to move, she did not keep a migraine diary recently, these happen in the morning which never occurred before, can be worse supine in the morning. She has vomited. Has not had an eye exam. No symptoms or signs of sleep apnea. No other focal neurologic deficits, associated symptoms, inciting events or modifiable factors.  Medications tried: maxalt, ondansetron, imitrex, topiramate, losartan, OTC meds, amitriptyline   Reviewed notes, labs and imaging from outside physicians, which showed:  12/27/2021: tsh normal     Latest Ref Rng & Units 01/18/2022    9:19 AM 11/18/2019    8:55 AM 02/04/2019   12:30 PM  CBC  WBC 3.8 - 10.8 Thousand/uL 5.8  7.5  6.7   Hemoglobin 11.7 - 15.5 g/dL 13.8  14.7  14.5   Hematocrit 35.0 - 45.0 % 41.3  45.0  43.1   Platelets 140 - 400 Thousand/uL 342  346  311        Latest Ref Rng & Units 01/18/2022    9:19 AM 11/18/2019    8:55 AM 02/04/2019   12:30 PM  CMP  Glucose 65 - 99 mg/dL 76  95  83   BUN 7 - 25 mg/dL '13  12  12   '$ Creatinine 0.50 - 1.03 mg/dL 0.74  0.89  0.81   Sodium 135 - 146 mmol/L 141  137  138   Potassium 3.5 - 5.3 mmol/L 4.6  4.2  4.3   Chloride 98 - 110 mmol/L 104  99  103  CO2 20 - 32 mmol/L '30  24  21   '$ Calcium 8.6 - 10.4 mg/dL 9.2  9.3  9.0   Total Protein 6.1 - 8.1 g/dL 6.4  6.8  6.4   Total Bilirubin 0.2 - 1.2 mg/dL 0.5  0.6  0.5   Alkaline Phos 48 - 121 IU/L  76  80   AST 10 - 35 U/L '19  19  17   '$ ALT 6 - 29 U/L '18  19  15      '$ Review of Systems: Patient complains of symptoms per HPI as well as the following symptoms migrianes. Pertinent negatives and positives per HPI. All others negative.   Social History   Socioeconomic History   Marital status: Married    Spouse name: Not on file   Number of children: Not on file   Years of education: Not on file   Highest education level: Not on file  Occupational History   Not on file  Tobacco Use   Smoking  status: Former    Packs/day: 0.03    Years: 4.00    Total pack years: 0.12    Types: Cigarettes    Quit date: 09/22/1998    Years since quitting: 23.4   Smokeless tobacco: Never  Vaping Use   Vaping Use: Never used  Substance and Sexual Activity   Alcohol use: Yes    Comment: social   Drug use: No   Sexual activity: Yes    Birth control/protection: Surgical  Other Topics Concern   Not on file  Social History Narrative   Caffiene 1 cup daily, soda no   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   Social Determinants of Health   Financial Resource Strain: Not on file  Food Insecurity: Not on file  Transportation Needs: Not on file  Physical Activity: Not on file  Stress: Not on file  Social Connections: Not on file  Intimate Partner Violence: Not on file    Family History  Problem Relation Age of Onset   Migraines Mother    Hypertension Mother    Hyperlipidemia Mother    Thyroid disease Mother    Obesity Mother    Diabetes Father    Hypertension Father    Hyperlipidemia Father    Heart disease Father    Cancer Father    Bipolar disorder Father    Obesity Father     Past Medical History:  Diagnosis Date   Fibroid    Hypertension    Hypothyroidism    Migraine    Thyroid disease     Patient Active Problem List   Diagnosis Date Noted   Migraine without aura and without status migrainosus, not intractable 02/28/2022   Polyphagia 11/02/2019   Class 1 obesity with serious comorbidity and body mass index (BMI) of 30.0 to 30.9 in adult 02/05/2019   Generalized anxiety disorder 03/13/2016   Insomnia disorder 03/13/2016   Essential hypertension 02/06/2016   Vitamin D deficiency 07/09/2014   History of migraine headaches 07/09/2014   Postpartum thyroiditis 07/09/2014   Attention deficit disorder without mention of hyperactivity 10/15/2012   Hypothyroidism 09/22/2010   Goiter 09/22/2010    Past Surgical History:  Procedure  Laterality Date   BILATERAL SALPINGECTOMY Bilateral 03/01/2014   Procedure: BILATERAL SALPINGECTOMY;  Surgeon: Lovenia Kim, MD;  Location: [facility];  Service: Gynecology;  Laterality: Bilateral;   DILATION AND CURETTAGE OF UTERUS     KNEE ARTHROSCOPY  [place] N/A 03/01/2014   Procedure: ROBOTIC ASSISTED TOTAL HYSTERECTOMY;  Surgeon: Lovenia Kim, MD;  Location: [facility];  Service: Gynecology;  Laterality: N/A;   TONSILLECTOMY      Current Outpatient Medications  Medication Sig Dispense Refill   amitriptyline (ELAVIL) 25 MG tablet Take 1 tablet (25 mg total) by mouth at bedtime. 90 tablet 3   levothyroxine (SYNTHROID) 88 MCG tablet Take 88 mcg by mouth daily before breakfast.     ondansetron (ZOFRAN-ODT) 4 MG disintegrating tablet Take 1 tablet (4 mg total) by mouth every 8 (eight) hours as needed for nausea or vomiting. 20 tablet 0   Rimegepant Sulfate (NURTEC) 75 MG TBDP Take 75 mg by mouth daily as needed. For migraines. Take as close to onset of migraine as possible. One daily maximum. 4 tablet 0   rizatriptan (MAXALT) 10 MG tablet Take 1 tablet (10 mg total) by mouth as needed for migraine. May repeat in 2 hours if needed 10 tablet 1   tirzepatide (MOUNJARO) 5 MG/0.5ML Pen Inject 5 mg under the skin once a week. 2 mL 0   Ubrogepant (UBRELVY) 100 MG TABS Take 100 mg by mouth every 2 (two) hours as needed. Maximum '200mg'$  a day. 2 tablet 0   No current facility-administered medications for this visit.    Allergies as of 02/28/2022 - Review Complete 02/28/2022  Allergen Reaction Noted   Erythromycin Anaphylaxis 09/22/2010   Tetracyclines & related Anaphylaxis 09/22/2010   Sulfa antibiotics Rash 09/22/2010    Vitals: BP (!) 145/89   Pulse 65   Ht '5\' 4"'$  (1.626 m)   Wt 160 lb 12.8 oz (72.9 kg)   BMI 27.60 kg/m  Last Weight:  Wt Readings from Last 1 Encounters:  02/28/22 160 lb 12.8 oz (72.9 kg)   Last Height:   Ht Readings from Last 1  Encounters:  02/28/22 '5\' 4"'$  (1.626 m)     Physical exam: Exam: Gen: NAD, conversant, well nourised, obese, well groomed                     CV: RRR, no MRG. No Carotid Bruits. No peripheral edema, warm, nontender Eyes: Conjunctivae clear without exudates or hemorrhage  Neuro: Detailed Neurologic Exam  Speech:    Speech is normal; fluent and spontaneous with normal comprehension.  Cognition:    The patient is oriented to person, place, and time;     recent and remote memory intact;     language fluent;     normal attention, concentration,     fund of knowledge Cranial Nerves:    The pupils are equal, round, and reactive to light. Left ONH margins blurry, right optic nerve head appears less so. Visual fields are full to finger confrontation. Extraocular movements are intact. Trigeminal sensation is intact and the muscles of mastication are normal. The face is symmetric. The palate elevates in the midline. Hearing intact. Voice is normal. Shoulder shrug is normal. The tongue has normal motion without fasciculations.   Coordination: nml  Gait: nml  Motor Observation:    No asymmetry, no atrophy, and no involuntary movements noted. Tone:    Normal muscle tone.    Posture:    Posture is normal. normal erect    Strength:    Strength is V/V in the upper and lower limbs.      Sensation: intact to LT     Reflex Exam:  DTR's:    Deep tendon reflexes  in the upper and lower extremities are normal bilaterally.   Toes:    The toes are downgoing bilaterally.   Clonus:    Clonus is absent.    Assessment/Plan:  Patient with likely recurrence of migraines but given new and concerning symptoms needs imaging  MRI brain due to concerning symptoms of morning headaches, positional headaches,right eye monocular vision changes vision changes and possible ONH edema, occipital and temporal headaches, , worsening headaches  to look for space occupying mass, chiari or intracranial  hypertension (pseudotumor), strokes, malignancies, vasculidities, demyelination(multiple sclerosis) or other  - Need a recent eye exam: Dr. Katy Fitch - MRI of the brain w/wo contrast as above - As needed/emergent: At onset of headache: Rizatriptan for migraine and ondansetron for nausea, can take them together and then repeat in 2 hours if needed. Other medications could consider are the new medications Nurtec, Ubrelvy - Prevention: since episodic, usually 2 a month wouldn't definitively start preventative but low threshold if they get worse - she has tried topiramate, will start amitriptyline for insomnia and migraines may help with both, if blood pressure is elevated(take readings at home) could try propranolol as well and then go to the newer medications, discussed the new class of medications such as qulipta, Ajovy and Emgality,  - blood pressure slightly elevated today, monitor at home several times a day - Trazodone not working not taking, can try amitriptyline can help with migraines and sleep - samples ubrelvy: Please take one tablet at the onset of your headache. If it does not improve the symptoms please take one additional tablet. If she likes it we can prescribe -samples nurtec once daily as needed, if she likes it we can prescribe - she is 51 cannot rule out changes in hormones causing worsening headaches may consider estrogen patch or nuva ring with period every 3 months  Orders Placed This Encounter  Procedures   MR BRAIN W WO CONTRAST   Ambulatory referral to Ophthalmology   Meds ordered this encounter  Medications   amitriptyline (ELAVIL) 25 MG tablet    Sig: Take 1 tablet (25 mg total) by mouth at bedtime.    Dispense:  90 tablet    Refill:  3   Rimegepant Sulfate (NURTEC) 75 MG TBDP    Sig: Take 75 mg by mouth daily as needed. For migraines. Take as close to onset of migraine as possible. One daily maximum.    Dispense:  4 tablet    Refill:  0    [id number] 10-2023   Ubrogepant  (UBRELVY) 100 MG TABS    Sig: Take 100 mg by mouth every 2 (two) hours as needed. Maximum '200mg'$  a day.    Dispense:  2 tablet    Refill:  0    [id number] 11/2023    Cc: Elby Showers, MD,  Baxley, Cresenciano Lick, MD  Sarina Ill, MD  [facility] [address] [place], [place] [address]  Phone [phone] Fax [phone]

## 2022-03-01 ENCOUNTER — Telehealth: Payer: Self-pay | Admitting: Neurology

## 2022-03-01 NOTE — Telephone Encounter (Signed)
Referral for Ophthalmology fax to [facility]. Phone: [phone], Fax: [phone] 

## 2022-03-07 ENCOUNTER — Telehealth: Payer: Self-pay | Admitting: Neurology

## 2022-03-07 NOTE — Telephone Encounter (Signed)
[employer] NPR via [employer] sent to [facility] [phone] 

## 2022-03-15 DIAGNOSIS — Z8639 Personal history of other endocrine, nutritional and metabolic disease: Secondary | ICD-10-CM | POA: Diagnosis not present

## 2022-03-15 DIAGNOSIS — E038 Other specified hypothyroidism: Secondary | ICD-10-CM | POA: Diagnosis not present

## 2022-03-15 DIAGNOSIS — G43009 Migraine without aura, not intractable, without status migrainosus: Secondary | ICD-10-CM | POA: Diagnosis not present

## 2022-03-15 DIAGNOSIS — R7301 Impaired fasting glucose: Secondary | ICD-10-CM | POA: Diagnosis not present

## 2022-03-23 ENCOUNTER — Other Ambulatory Visit (HOSPITAL_COMMUNITY): Payer: Self-pay

## 2022-03-26 ENCOUNTER — Other Ambulatory Visit (HOSPITAL_COMMUNITY): Payer: Self-pay

## 2022-03-26 DIAGNOSIS — H5460 Unqualified visual loss, one eye, unspecified: Secondary | ICD-10-CM | POA: Diagnosis not present

## 2022-03-26 MED ORDER — MOUNJARO 5 MG/0.5ML ~~LOC~~ SOAJ
5.0000 mg | SUBCUTANEOUS | 11 refills | Status: DC
  Filled 2022-03-26 – 2022-03-29 (×2): qty 2, 28d supply, fill #0

## 2022-03-27 ENCOUNTER — Other Ambulatory Visit (HOSPITAL_COMMUNITY): Payer: Self-pay

## 2022-03-27 MED ORDER — MOUNJARO 5 MG/0.5ML ~~LOC~~ SOAJ
5.0000 mg | SUBCUTANEOUS | 0 refills | Status: DC
  Filled 2022-03-27: qty 2, 28d supply, fill #0

## 2022-03-28 ENCOUNTER — Ambulatory Visit
Admission: RE | Admit: 2022-03-28 | Discharge: 2022-03-28 | Disposition: A | Discharge status: Home / Self Care | Payer: BC Managed Care – PPO | Source: Ambulatory Visit | Attending: Neurology | Admitting: Neurology

## 2022-03-28 DIAGNOSIS — H5460 Unqualified visual loss, one eye, unspecified: Secondary | ICD-10-CM | POA: Diagnosis not present

## 2022-03-28 DIAGNOSIS — R51 Headache with orthostatic component, not elsewhere classified: Secondary | ICD-10-CM

## 2022-03-28 DIAGNOSIS — H4710 Unspecified papilledema: Secondary | ICD-10-CM

## 2022-03-28 DIAGNOSIS — H539 Unspecified visual disturbance: Secondary | ICD-10-CM

## 2022-03-28 DIAGNOSIS — R519 Headache, unspecified: Secondary | ICD-10-CM

## 2022-03-28 MED ORDER — GADOPICLENOL 0.5 MMOL/ML IV SOLN
7.5000 mL | Freq: Once | INTRAVENOUS | Status: AC | PRN
  Administered 2022-03-28: 7.5 mL via INTRAVENOUS

## 2022-03-29 ENCOUNTER — Other Ambulatory Visit (HOSPITAL_COMMUNITY): Payer: Self-pay

## 2022-03-29 ENCOUNTER — Other Ambulatory Visit: Payer: BC Managed Care – PPO

## 2022-04-11 ENCOUNTER — Other Ambulatory Visit (HOSPITAL_COMMUNITY): Payer: Self-pay

## 2022-04-11 DIAGNOSIS — R632 Polyphagia: Secondary | ICD-10-CM | POA: Diagnosis not present

## 2022-04-11 MED ORDER — MOUNJARO 7.5 MG/0.5ML ~~LOC~~ SOAJ
7.5000 mg | SUBCUTANEOUS | 1 refills | Status: DC
  Filled 2022-04-11: qty 2, 30d supply, fill #0
  Filled 2022-04-16 – 2022-04-20 (×2): qty 2, 28d supply, fill #0

## 2022-04-16 ENCOUNTER — Other Ambulatory Visit (HOSPITAL_COMMUNITY): Payer: Self-pay

## 2022-04-20 ENCOUNTER — Other Ambulatory Visit (HOSPITAL_COMMUNITY): Payer: Self-pay

## 2022-05-03 ENCOUNTER — Other Ambulatory Visit: Payer: BC Managed Care – PPO

## 2022-05-03 DIAGNOSIS — E039 Hypothyroidism, unspecified: Secondary | ICD-10-CM | POA: Diagnosis not present

## 2022-05-03 DIAGNOSIS — Z13220 Encounter for screening for lipoid disorders: Secondary | ICD-10-CM

## 2022-05-03 DIAGNOSIS — R7302 Impaired glucose tolerance (oral): Secondary | ICD-10-CM | POA: Diagnosis not present

## 2022-05-03 DIAGNOSIS — I10 Essential (primary) hypertension: Secondary | ICD-10-CM

## 2022-05-03 DIAGNOSIS — H5310 Unspecified subjective visual disturbances: Secondary | ICD-10-CM | POA: Diagnosis not present

## 2022-05-04 LAB — CBC WITH DIFFERENTIAL/PLATELET
Absolute Monocytes: 697 cells/uL (ref 200–950)
Basophils Absolute: 81 cells/uL (ref 0–200)
Basophils Relative: 1 %
Eosinophils Absolute: 138 cells/uL (ref 15–500)
Eosinophils Relative: 1.7 %
HCT: 44.9 % (ref 35.0–45.0)
Hemoglobin: 15.1 g/dL (ref 11.7–15.5)
Lymphs Abs: 2341 cells/uL (ref 850–3900)
MCH: 29.8 pg (ref 27.0–33.0)
MCHC: 33.6 g/dL (ref 32.0–36.0)
MCV: 88.6 fL (ref 80.0–100.0)
MPV: 9.1 fL (ref 7.5–12.5)
Monocytes Relative: 8.6 %
Neutro Abs: 4844 cells/uL (ref 1500–7800)
Neutrophils Relative %: 59.8 %
Platelets: 394 10*3/uL (ref 140–400)
RBC: 5.07 10*6/uL (ref 3.80–5.10)
RDW: 12.4 % (ref 11.0–15.0)
Total Lymphocyte: 28.9 %
WBC: 8.1 10*3/uL (ref 3.8–10.8)

## 2022-05-04 LAB — COMPLETE METABOLIC PANEL WITH GFR
AG Ratio: 1.7 (calc) (ref 1.0–2.5)
ALT: 15 U/L (ref 6–29)
AST: 18 U/L (ref 10–35)
Albumin: 4.4 g/dL (ref 3.6–5.1)
Alkaline phosphatase (APISO): 72 U/L (ref 37–153)
BUN: 17 mg/dL (ref 7–25)
CO2: 26 mmol/L (ref 20–32)
Calcium: 9.4 mg/dL (ref 8.6–10.4)
Chloride: 101 mmol/L (ref 98–110)
Creat: 0.77 mg/dL (ref 0.50–1.03)
Globulin: 2.6 g/dL (calc) (ref 1.9–3.7)
Glucose, Bld: 71 mg/dL (ref 65–99)
Potassium: 4.1 mmol/L (ref 3.5–5.3)
Sodium: 139 mmol/L (ref 135–146)
Total Bilirubin: 0.6 mg/dL (ref 0.2–1.2)
Total Protein: 7 g/dL (ref 6.1–8.1)
eGFR: 94 mL/min/{1.73_m2} (ref >=60)

## 2022-05-04 LAB — LIPID PANEL
Cholesterol: 215 mg/dL — ABNORMAL HIGH (ref <200)
HDL: 90 mg/dL (ref >=50)
LDL Cholesterol (Calc): 111 mg/dL (calc) — ABNORMAL HIGH
Non-HDL Cholesterol (Calc): 125 mg/dL (calc) (ref <130)
Total CHOL/HDL Ratio: 2.4 (calc) (ref <5.0)
Triglycerides: 57 mg/dL (ref <150)

## 2022-05-04 LAB — HEMOGLOBIN A1C
Hgb A1c MFr Bld: 5.3 % of total Hgb (ref <5.7)
Mean Plasma Glucose: 105 mg/dL
eAG (mmol/L): 5.8 mmol/L

## 2022-05-04 LAB — TSH: TSH: 1.91 mIU/L

## 2022-05-09 ENCOUNTER — Other Ambulatory Visit (HOSPITAL_COMMUNITY): Payer: Self-pay

## 2022-05-09 DIAGNOSIS — Z8639 Personal history of other endocrine, nutritional and metabolic disease: Secondary | ICD-10-CM | POA: Diagnosis not present

## 2022-05-09 DIAGNOSIS — R632 Polyphagia: Secondary | ICD-10-CM | POA: Diagnosis not present

## 2022-05-09 MED ORDER — MOUNJARO 15 MG/0.5ML ~~LOC~~ SOAJ
15.0000 mg | SUBCUTANEOUS | 3 refills | Status: DC
  Filled 2022-05-09: qty 2, 28d supply, fill #0

## 2022-05-10 ENCOUNTER — Other Ambulatory Visit: Payer: BC Managed Care – PPO

## 2022-05-10 NOTE — Progress Notes (Shared)
Patient Care Team: Elby Showers, MD as PCP - General (Internal Medicine)  Visit Date: 05/10/22  Subjective:    Patient ID: Jane Avila , Female   DOB: 13-Feb-1971, 52 y.o.    MRN: [id number]   52 y.o. Female presents today for an annual comprehensive physical exam. Patient has a past medical history of fibroid, hypertension, hypothyroidism, migraines, thyroid disease.  History of Type 2 diabetes mellitus treated with Mounjaro 7.5 mg once weekly.  History of depression treated with Elavil 25 mg daily at bedtime.  History of hypothyroidism 88 mcg daily before breakfast.  History of migraines treated with Ubrelvy 100 mg every two hours as needed, Nurtec 75 mg daily as needed.  Status post hysterectomy.  Mammogram last completed 01/10/22. Recommended repeat in 2025.   Colonoscopy never completed. Reports negative Cologuard 2021.   Vaccine Counseling: UTD on flu, tetanus, shingles, Covid-19 vaccines.  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      ROS      Objective:   Vitals: There were no vitals taken for this visit.   Physical Exam    Results:   Studies obtained and personally reviewed by me:  Imaging, colonoscopy, mammogram, bone density scan, echocardiogram, heart cath, stress test, CT calcium score, etc. ***   Labs:       Component Value Date/Time   NA 139 05/03/2022 1057   NA 137  11/18/2019 0855   K 4.1 05/03/2022 1057   CL 101 05/03/2022 1057   CO2 26 05/03/2022 1057   GLUCOSE 71 05/03/2022 1057   BUN 17 05/03/2022 1057   BUN 12 11/18/2019 0855   CREATININE 0.77 05/03/2022 1057   CALCIUM 9.4 05/03/2022 1057   PROT 7.0 05/03/2022 1057   PROT 6.8 11/18/2019 0855   ALBUMIN 4.6 11/18/2019 0855   AST 18 05/03/2022 1057   ALT 15 05/03/2022 1057   ALKPHOS 76 11/18/2019 0855   BILITOT 0.6 05/03/2022 1057   BILITOT 0.6 11/18/2019 0855   GFRNONAA 77 11/18/2019 0855   GFRNONAA >89 07/06/2014 0919   GFRAA 89 11/18/2019 0855   GFRAA >89 07/06/2014 0919     Lab Results  Component Value Date   WBC 8.1 05/03/2022   HGB 15.1 05/03/2022   HCT 44.9 05/03/2022   MCV 88.6 05/03/2022   PLT 394 05/03/2022    Lab Results  Component Value Date   CHOL 215 (H) 05/03/2022   HDL 90 05/03/2022   LDLCALC 111 (H) 05/03/2022   TRIG 57 05/03/2022   CHOLHDL 2.4 05/03/2022    Lab Results  Component Value Date   HGBA1C 5.3 05/03/2022     Lab Results  Component Value Date   TSH 1.91 05/03/2022     No results found  for: "PSA1", "PSA" *** delete for female pts  ***    Assessment & Plan:   ***    I,Alexander Ruley,acting as a scribe for Elby Showers, MD.,have documented all relevant documentation on the behalf of Elby Showers, MD,as directed by  Elby Showers, MD while in the presence of Elby Showers, MD.   ***

## 2022-05-17 ENCOUNTER — Encounter: Payer: BC Managed Care – PPO | Admitting: Internal Medicine

## 2022-05-18 ENCOUNTER — Telehealth: Payer: Self-pay | Admitting: Internal Medicine

## 2022-05-18 NOTE — Telephone Encounter (Signed)
LVM to CB if she would like to move CPE to 05/31/2022 at 10:00

## 2022-05-24 ENCOUNTER — Other Ambulatory Visit: Payer: Self-pay

## 2022-05-24 ENCOUNTER — Other Ambulatory Visit (HOSPITAL_COMMUNITY): Payer: Self-pay

## 2022-05-30 ENCOUNTER — Emergency Department (HOSPITAL_COMMUNITY): Payer: BC Managed Care – PPO

## 2022-05-30 ENCOUNTER — Emergency Department (HOSPITAL_COMMUNITY)
Admission: EM | Admit: 2022-05-30 | Discharge: 2022-05-30 | Disposition: A | Discharge status: Home / Self Care | Payer: BC Managed Care – PPO | Attending: Emergency Medicine | Admitting: Emergency Medicine

## 2022-05-30 ENCOUNTER — Other Ambulatory Visit: Payer: Self-pay

## 2022-05-30 ENCOUNTER — Encounter (HOSPITAL_COMMUNITY): Payer: Self-pay

## 2022-05-30 DIAGNOSIS — R109 Unspecified abdominal pain: Secondary | ICD-10-CM | POA: Diagnosis not present

## 2022-05-30 DIAGNOSIS — R1011 Right upper quadrant pain: Secondary | ICD-10-CM | POA: Diagnosis not present

## 2022-05-30 DIAGNOSIS — N200 Calculus of kidney: Secondary | ICD-10-CM | POA: Diagnosis not present

## 2022-05-30 LAB — URINALYSIS, ROUTINE W REFLEX MICROSCOPIC
Bilirubin Urine: NEGATIVE
Glucose, UA: NEGATIVE mg/dL
Hgb urine dipstick: NEGATIVE
Ketones, ur: NEGATIVE mg/dL
Leukocytes,Ua: NEGATIVE
Nitrite: NEGATIVE
Protein, ur: NEGATIVE mg/dL
Specific Gravity, Urine: 1.015 (ref 1.005–1.030)
pH: 8.5 — ABNORMAL HIGH (ref 5.0–8.0)

## 2022-05-30 LAB — CBC WITH DIFFERENTIAL/PLATELET
Abs Immature Granulocytes: 0.07 10*3/uL (ref 0.00–0.07)
Basophils Absolute: 0.1 10*3/uL (ref 0.0–0.1)
Basophils Relative: 1 %
Eosinophils Absolute: 0.1 10*3/uL (ref 0.0–0.5)
Eosinophils Relative: 2 %
HCT: 46.5 % — ABNORMAL HIGH (ref 36.0–46.0)
Hemoglobin: 15.1 g/dL — ABNORMAL HIGH (ref 12.0–15.0)
Immature Granulocytes: 1 %
Lymphocytes Relative: 36 %
Lymphs Abs: 3.1 10*3/uL (ref 0.7–4.0)
MCH: 29.3 pg (ref 26.0–34.0)
MCHC: 32.5 g/dL (ref 30.0–36.0)
MCV: 90.1 fL (ref 80.0–100.0)
Monocytes Absolute: 0.8 10*3/uL (ref 0.1–1.0)
Monocytes Relative: 9 %
Neutro Abs: 4.4 10*3/uL (ref 1.7–7.7)
Neutrophils Relative %: 51 %
Platelets: 384 10*3/uL (ref 150–400)
RBC: 5.16 MIL/uL — ABNORMAL HIGH (ref 3.87–5.11)
RDW: 13 % (ref 11.5–15.5)
WBC: 8.6 10*3/uL (ref 4.0–10.5)
nRBC: 0 % (ref 0.0–0.2)

## 2022-05-30 LAB — COMPREHENSIVE METABOLIC PANEL
ALT: 20 U/L (ref 0–44)
AST: 26 U/L (ref 15–41)
Albumin: 4.7 g/dL (ref 3.5–5.0)
Alkaline Phosphatase: 72 U/L (ref 38–126)
Anion gap: 12 (ref 5–15)
BUN: 12 mg/dL (ref 6–20)
CO2: 28 mmol/L (ref 22–32)
Calcium: 9.5 mg/dL (ref 8.9–10.3)
Chloride: 98 mmol/L (ref 98–111)
Creatinine, Ser: 0.93 mg/dL (ref 0.44–1.00)
GFR, Estimated: >60 mL/min (ref >60)
Glucose, Bld: 128 mg/dL — ABNORMAL HIGH (ref 70–99)
Potassium: 4.1 mmol/L (ref 3.5–5.1)
Sodium: 138 mmol/L (ref 135–145)
Total Bilirubin: 0.5 mg/dL (ref 0.3–1.2)
Total Protein: 7.8 g/dL (ref 6.5–8.1)

## 2022-05-30 LAB — LIPASE, BLOOD: Lipase: 42 U/L (ref 11–51)

## 2022-05-30 MED ORDER — SODIUM CHLORIDE 0.9 % IV BOLUS
1000.0000 mL | Freq: Once | INTRAVENOUS | Status: AC
  Administered 2022-05-30: 1000 mL via INTRAVENOUS

## 2022-05-30 MED ORDER — ONDANSETRON HCL 4 MG/2ML IJ SOLN
4.0000 mg | Freq: Once | INTRAMUSCULAR | Status: AC
  Administered 2022-05-30: 4 mg via INTRAVENOUS
  Filled 2022-05-30: qty 2

## 2022-05-30 MED ORDER — HYDROMORPHONE HCL 1 MG/ML IJ SOLN
1.0000 mg | Freq: Once | INTRAMUSCULAR | Status: AC
  Administered 2022-05-30: 1 mg via INTRAVENOUS
  Filled 2022-05-30: qty 1

## 2022-05-30 MED ORDER — IOHEXOL 300 MG/ML  SOLN
100.0000 mL | Freq: Once | INTRAMUSCULAR | Status: AC | PRN
  Administered 2022-05-30: 100 mL via INTRAVENOUS

## 2022-05-30 MED ORDER — FENTANYL CITRATE PF 50 MCG/ML IJ SOSY
50.0000 ug | PREFILLED_SYRINGE | Freq: Once | INTRAMUSCULAR | Status: AC
  Administered 2022-05-30: 50 ug via INTRAVENOUS
  Filled 2022-05-30: qty 1

## 2022-05-30 MED ORDER — KETOROLAC TROMETHAMINE 15 MG/ML IJ SOLN
15.0000 mg | Freq: Once | INTRAMUSCULAR | Status: AC
  Administered 2022-05-30: 15 mg via INTRAVENOUS
  Filled 2022-05-30: qty 1

## 2022-05-30 MED ORDER — SODIUM CHLORIDE (PF) 0.9 % IJ SOLN
INTRAMUSCULAR | Status: AC
  Filled 2022-05-30: qty 50

## 2022-05-30 NOTE — Discharge Instructions (Signed)
Your evaluation in the ED was reassuring.  We recommend follow-up with your primary care doctor.  It may be beneficial to proceed with MiraLAX twice a day for the next week or so.  Use Tylenol or ibuprofen at home for management of residual pain or discomfort.  Return for new or concerning symptoms.

## 2022-05-30 NOTE — ED Notes (Signed)
Pt verbalizes understanding of discharge instructions. IV removed. Pt ambulated from ED with steady gait.

## 2022-05-30 NOTE — ED Triage Notes (Signed)
Pt reports with sharp right flank pain x 2 hrs with nausea.

## 2022-05-30 NOTE — ED Provider Notes (Signed)
[place] [facility]GENC[facility] DEPARTMENT AT [facility] Provider Note   CSN: [id number] Arrival date & time: 05/30/22  0335     History  Chief Complaint  Patient presents with   Flank Pain    Jane Avila is a 52 y.o. female.  52 year old female with no significant past medical history presents to the emergency department for sudden onset of right-sided abdominal pain which woke her from sleep 2 hours prior to arrival, just before 2 AM.  She states that pain has been constant and waxing and waning in severity, but without known modifying factors.  She has not taken any medications for her symptoms.  Does complain of associated nausea without emesis.  No fevers, urinary symptoms, bowel changes.  Abdominal surgical history significant for hysterectomy and salpingectomy.  Denies history of kidney stones or gallstones.  The history is provided by the patient. No language interpreter was used.  Flank Pain       Home Medications Prior to Admission medications   Medication Sig Start Date End Date Taking? Authorizing Provider  amitriptyline (ELAVIL) 25 MG tablet Take 1 tablet (25 mg total) by mouth at bedtime. 02/28/22   Melvenia Beam, MD  levothyroxine (SYNTHROID) 88 MCG tablet Take 88 mcg by mouth daily before breakfast.    [provider]  ondansetron (ZOFRAN-ODT) 4 MG disintegrating tablet Take 1 tablet (4 mg total) by mouth every 8 (eight) hours as needed for nausea or vomiting. 01/11/22   Elby Showers, MD  Rimegepant Sulfate (NURTEC) 75 MG TBDP Take 75 mg by mouth daily as needed. For migraines. Take as close to onset of migraine as possible. One daily maximum. 02/28/22   Melvenia Beam, MD  rizatriptan (MAXALT) 10 MG tablet Take 1 tablet (10 mg total) by mouth as needed for migraine. May repeat in 2 hours if needed 01/11/22   Elby Showers, MD  tirzepatide [facility]) 15 MG/0.5ML Pen Inject 15 mg into the skin once a week. 05/09/22     tirzepatide  (MOUNJARO) 5 MG/0.5ML Pen Inject 5 mg into the skin once a week. 03/26/22     tirzepatide (MOUNJARO) 5 MG/0.5ML Pen Inject 5 mg into the skin once a week. 03/27/22     tirzepatide (MOUNJARO) 7.5 MG/0.5ML Pen Inject 7.5 mg into the skin once a week. 04/11/22     Ubrogepant (UBRELVY) 100 MG TABS Take 100 mg by mouth every 2 (two) hours as needed. Maximum 200mg  a day. 02/28/22   Melvenia Beam, MD      Allergies    Erythromycin, Tetracyclines & related, and Sulfa antibiotics    Review of Systems   Review of Systems  Genitourinary:  Positive for flank pain.  Ten systems reviewed and are negative for acute change, except as noted in the HPI.    Physical Exam Updated Vital Signs BP (!) 147/93   Pulse 71   Temp 97.6 F (36.4 C) (Oral)   Resp 18   Ht 5\' 4"  (1.626 m)   Wt 72.6 kg   SpO2 100%   BMI 27.46 kg/m   Physical Exam Vitals and nursing note reviewed.  Constitutional:      General: She is not in acute distress.    Appearance: She is well-developed. She is not diaphoretic.     Comments: Appears uncomfortable; holding R side of abdomen with hand.  HENT:     Head: Normocephalic and atraumatic.  Eyes:     General: No scleral icterus.  Conjunctiva/sclera: Conjunctivae normal.  Cardiovascular:     Rate and Rhythm: Normal rate and regular rhythm.     Pulses: Normal pulses.  Pulmonary:     Effort: Pulmonary effort is normal. No respiratory distress.     Breath sounds: No stridor.     Comments: Respirations even and unlabored Abdominal:     Palpations: Abdomen is soft.     Tenderness: There is abdominal tenderness. There is guarding.     Comments: RUQ and right mid abdominal TTP. Mild voluntary guarding. No peritoneal signs or palpable masses. No CVA TTP.  Musculoskeletal:        General: Normal range of motion.     Cervical back: Normal range of motion.  Skin:    General: Skin is warm and dry.     Coloration: Skin is not pale.     Findings: No erythema or rash.   Neurological:     Mental Status: She is alert and oriented to person, place, and time.  Psychiatric:        Behavior: Behavior normal.     ED Results / Procedures / Treatments   Labs (all labs ordered are listed, but only abnormal results are displayed) Labs Reviewed  COMPREHENSIVE METABOLIC PANEL - Abnormal; Notable for the following components:      Result Value   Glucose, Bld 128 (*)    All other components within normal limits  CBC WITH DIFFERENTIAL/PLATELET - Abnormal; Notable for the following components:   RBC 5.16 (*)    Hemoglobin 15.1 (*)    HCT 46.5 (*)    All other components within normal limits  URINALYSIS, ROUTINE W REFLEX MICROSCOPIC - Abnormal; Notable for the following components:   pH 8.5 (*)    Bacteria, UA RARE (*)    All other components within normal limits  LIPASE, BLOOD    EKG None  Radiology US RENAL  Result Date: 05/30/2022 CLINICAL DATA:  Acute right-sided abdominal pain EXAM: RENAL / URINARY TRACT ULTRASOUND COMPLETE COMPARISON:  None Available. FINDINGS: Right Kidney: Renal measurements: 10.5 x 4.3 x 4.1 cm = volume: 95.0 mL. Echogenicity within normal limits. No mass or hydronephrosis visualized. Left Kidney: Renal measurements: 10.3 x 4.4 x 5.1 cm = volume: 120.8 mL. Echogenicity within normal limits. No mass or hydronephrosis visualized. Bladder: Appears normal for degree of bladder distention. Other: None. IMPRESSION: Normal renal sonogram. Electronically Signed   By: Kerby Moors M.D.   On: 05/30/2022 05:25   US Abdomen Limited  Result Date: 05/30/2022 CLINICAL DATA:  Right upper quadrant pain EXAM: ULTRASOUND ABDOMEN LIMITED RIGHT UPPER QUADRANT COMPARISON:  None Available. FINDINGS: Gallbladder: No gallstones or wall thickening visualized. No sonographic Murphy sign noted by sonographer. Common bile duct: Diameter: 4 mm Liver: No focal lesion identified. Within normal limits in parenchymal echogenicity. Portal vein is patent on color  Doppler imaging with normal direction of blood flow towards the liver. IMPRESSION: Normal right upper quadrant ultrasound. Electronically Signed   By: Jorje Guild M.D.   On: 05/30/2022 05:20    CT ABDOMEN PELVIS W CONTRAST  Result Date: 05/30/2022 CLINICAL DATA:  Acute, nonlocalized abdominal pain EXAM: CT ABDOMEN AND PELVIS WITH CONTRAST TECHNIQUE: Multidetector CT imaging of the abdomen and pelvis was performed using the standard protocol following bolus administration of intravenous contrast. RADIATION DOSE REDUCTION: This exam was performed according to the departmental dose-optimization program which includes automated exposure control, adjustment of the mA and/or kV according to patient size and/or use of iterative reconstruction technique.  CONTRAST:  131mL OMNIPAQUE IOHEXOL 300 MG/ML  SOLN COMPARISON:  None Available. FINDINGS: Lower chest:  No contributory findings. Hepatobiliary: No focal liver abnormality.No evidence of biliary obstruction or stone. Pancreas: Unremarkable. Spleen: Unremarkable. Adrenals/Urinary Tract: Negative adrenals. No hydronephrosis or ureteral stone. Two tiny low densities in the right kidney, too small for accurate densitometry, usually cysts. Punctate left renal calculus. Unremarkable bladder. Stomach/Bowel: No obstruction. No appendicitis or other visible inflammation. Vascular/Lymphatic: No acute vascular abnormality. No mass or adenopathy. Reproductive:Hysterectomy Other: No ascites or pneumoperitoneum.  Tiny fatty umbilical hernia. Musculoskeletal: No acute abnormalities. IMPRESSION: 1. No acute finding. 2. Punctate left renal calculus. Electronically Signed   By: Jorje Guild M.D.   On: 05/30/2022 06:20    Procedures Procedures    Medications Ordered in ED Medications  sodium chloride (PF) 0.9 % injection (0 mLs  Hold 05/30/22 0549)  fentaNYL (SUBLIMAZE) injection 50 mcg (50 mcg Intravenous Given 05/30/22 0415)  ondansetron (ZOFRAN) injection 4 mg (4 mg  Intravenous Given 05/30/22 0415)  sodium chloride 0.9 % bolus 1,000 mL (1,000 mLs Intravenous New Bag/Given 05/30/22 0416)  HYDROmorphone (DILAUDID) injection 1 mg (1 mg Intravenous Given 05/30/22 0531)  ketorolac (TORADOL) 15 MG/ML injection 15 mg (15 mg Intravenous Given 05/30/22 0531)  iohexol (OMNIPAQUE) 300 MG/ML solution 100 mL (100 mLs Intravenous Contrast Given 05/30/22 0549)    ED Course/ Medical Decision Making/ A&P Clinical Course as of 05/30/22 [id number]  Wed May 30, 2022  0520 Relayed to patient that her labs have been reassuring.  No clear indication as to what may be causing her pain.  Imaging presently pending.  States that her pain improved, but never fully resolved.  It is now returning.  Will give additional medications for pain control. [id number] Ultrasound imaging without evidence of gallstones.  No hydronephrosis to suggest obstructive uropathy.  Given persistence of pain and perceived degree of discomfort, will proceed with CT abdomen/pelvis for further evaluation. [KH]  0605 I have viewed the patient's CT scan.  Do not see any evidence of acute pathology.  Pending formal radiologist read. [KH]  0626 CT IMPRESSION: 1. No acute finding. 2. Punctate left renal calculus.  [KH]    Clinical Course User Index [KH] Antonietta Breach, PA-C                             Medical Decision Making Amount and/or Complexity of Data Reviewed Labs: ordered. Radiology: ordered.  Risk Prescription drug management.   This patient presents to the ED for concern of R sided abdominal pain, this involves an extensive number of treatment options, and is a complaint that carries with it a high risk of complications and morbidity.  The differential diagnosis includes biliary colic vs RLL pneumonia vs kidney stone vs pyelonephritis vs appendicitis vs ovarian torsion vs ruptured viscous.   Co morbidities that complicate the patient evaluation  HTN Hypothyroid   Additional history  obtained:  Additional history obtained from spouse at bedside   Lab Tests:  I Ordered, and personally interpreted labs.  The pertinent results include:  CBG 128. Otherwise CBC, CMP, lipase are negative.   Imaging Studies ordered:  I ordered imaging studies including US renal and [name] RUQ  I independently visualized and interpreted imaging which showed no acute pathology.  I agree with the radiologist interpretation. CT pending   Cardiac Monitoring:  The patient was maintained on a cardiac monitor.  I personally viewed and interpreted the cardiac  monitored which showed an underlying rhythm of: NSR   Medicines ordered and prescription drug management:  I ordered medication including Fentanyl, Toradol, Dilaudid for pain Reevaluation of the patient after these medicines showed that the patient improved I have reviewed the patients home medicines and have made adjustments as needed   Test Considered:  Pelvic US - however, hx of hysterectomy, salpingectomy   Problem List / ED Course:  As above   Reevaluation:  After the interventions noted above, I reevaluated the patient and found that they have :improved   Social Determinants of Health:  Good social support. Spouse at bedside.   Dispostion:  After consideration of the diagnostic results and the patients response to treatment, I feel that the patent would benefit from outpatient symptom control and PCP follow up. Return precautions discussed and provided. Patient discharged in stable condition with no unaddressed concerns.          Final Clinical Impression(s) / ED Diagnoses Final diagnoses:  Right upper quadrant abdominal pain    Rx / DC Orders ED Discharge Orders     None         Antonietta Breach, PA-C 06/09/22 0431    Margette Fast, MD 06/09/22 [id number]

## 2022-06-06 ENCOUNTER — Encounter: Payer: Self-pay | Admitting: Neurology

## 2022-06-06 ENCOUNTER — Telehealth: Payer: Self-pay | Admitting: Neurology

## 2022-06-06 ENCOUNTER — Telehealth (INDEPENDENT_AMBULATORY_CARE_PROVIDER_SITE_OTHER): Payer: BC Managed Care – PPO | Admitting: Neurology

## 2022-06-06 DIAGNOSIS — G43009 Migraine without aura, not intractable, without status migrainosus: Secondary | ICD-10-CM

## 2022-06-06 MED ORDER — AMITRIPTYLINE HCL 50 MG PO TABS: 50.0000 mg | ORAL_TABLET | Freq: Every day | ORAL | 11 refills | Status: DC

## 2022-06-06 MED ORDER — AMITRIPTYLINE HCL 25 MG PO TABS: 37.5000 mg | ORAL_TABLET | Freq: Every day | ORAL | 4 refills | Status: DC

## 2022-06-06 NOTE — Patient Instructions (Signed)
Increase amitriptyline  Amitriptyline Tablets What is this medication? AMITRIPTYLINE (a mee TRIP ti leen) treats depression. It increases the amount of serotonin and norepinephrine in the brain, hormones that help regulate mood. It belongs to a group of medications called tricyclic antidepressants (TCAs). This medicine may be used for other purposes; ask your health care provider or pharmacist if you have questions. COMMON BRAND NAME(S): Elavil, Vanatrip What should I tell my care team before I take this medication? They need to know if you have any of these conditions: An alcohol problem Asthma, trouble breathing Bipolar disorder or schizophrenia Difficulty passing urine, prostate trouble Glaucoma Heart disease or previous heart attack Liver disease Over active thyroid Seizures Thoughts or plans of suicide, a previous suicide attempt, or family history of suicide attempt An unusual or allergic reaction to amitriptyline, other medications, foods, dyes, or preservatives Pregnant or trying to get pregnant Breast-feeding How should I use this medication? Take this medication by mouth with a drink of water. Follow the directions on the prescription label. You can take the tablets with or without food. Take your medication at regular intervals. Do not take it more often than directed. Do not stop taking this medication suddenly except upon the advice of your care team. Stopping this medication too quickly may cause serious side effects or your condition may worsen. A special MedGuide will be given to you by the pharmacist with each prescription and refill. Be sure to read this information carefully each time. Talk to your care team regarding the use of this medication in children. Special care may be needed. Overdosage: If you think you have taken too much of this medicine contact a poison control center or emergency room at once. NOTE: This medicine is only for you. Do not share this medicine  with others. What if I miss a dose? If you miss a dose, take it as soon as you can. If it is almost time for your next dose, take only that dose. Do not take double or extra doses. What may interact with this medication? Do not take this medication with any of the following: Arsenic trioxide Certain medications used to regulate abnormal heartbeat or to treat other heart conditions Cisapride Droperidol Halofantrine Linezolid MAOIs like Carbex, Eldepryl, Marplan, Nardil, and Parnate Methylene blue Other medications for mental depression Phenothiazines like perphenazine, thioridazine and chlorpromazine Pimozide Probucol Procarbazine Sparfloxacin St. John's Wort This medication may also interact with the following: Atropine and related medications like hyoscyamine, scopolamine, tolterodine and others Barbiturate medications for inducing sleep or treating seizures, like phenobarbital Cimetidine Disulfiram Ethchlorvynol Thyroid hormones such as levothyroxine Ziprasidone This list may not describe all possible interactions. Give your health care provider a list of all the medicines, herbs, non-prescription drugs, or dietary supplements you use. Also tell them if you smoke, drink alcohol, or use illegal drugs. Some items may interact with your medicine. What should I watch for while using this medication? Tell your care team if your symptoms do not get better or if they get worse. Visit your care team for regular checks on your progress. Because it may take several weeks to see the full effects of this medication, it is important to continue your treatment as prescribed by your care team. Patients and their families should watch out for new or worsening thoughts of suicide or depression. Also watch out for sudden changes in feelings such as feeling anxious, agitated, panicky, irritable, hostile, aggressive, impulsive, severely restless, overly excited and hyperactive, or not being able  to  sleep. If this happens, especially at the beginning of treatment or after a change in dose, call your care team. You may get drowsy or dizzy. Do not drive, use machinery, or do anything that needs mental alertness until you know how this medication affects you. Do not stand or sit up quickly, especially if you are an older patient. This reduces the risk of dizzy or fainting spells. Alcohol may interfere with the effect of this medication. Avoid alcoholic drinks. Do not treat yourself for coughs, colds, or allergies without asking your care team for advice. Some ingredients can increase possible side effects. Your mouth may get dry. Chewing sugarless gum or sucking hard candy, and drinking plenty of water will help. Contact your care team if the problem does not go away or is severe. This medication may cause dry eyes and blurred vision. If you wear contact lenses you may feel some discomfort. Lubricating drops may help. See your eye doctor if the problem does not go away or is severe. This medication can cause constipation. Try to have a bowel movement at least every 2 to 3 days. If you do not have a bowel movement for 3 days, call your care team. This medication can make you more sensitive to the sun. Keep out of the sun. If you cannot avoid being in the sun, wear protective clothing and use sunscreen. Do not use sun lamps or tanning beds/booths. What side effects may I notice from receiving this medication? Side effects that you should report to your care team as soon as possible: Allergic reactions--skin rash, itching, hives, swelling of the face, lips, tongue, or throat Heart rhythm changes-- fast or irregular heartbeat, dizziness, feeling faint or lightheaded, chest pain, trouble breathing Serotonin syndrome--irritability, confusion, fast or irregular heartbeat, muscle stiffness, twitching muscles, sweating, high fever, seizure, chills, vomiting, diarrhea Sudden eye pain or change in vision such as  blurry vision, seeing halos around lights, vision loss Thoughts of suicide or self-harm, worsening mood, feelings of depression Side effects that usually do not require medical attention (report to your care team if they continue or are bothersome): Change in appetite or weight Change in sex drive or performance Constipation Dizziness Drowsiness Dry mouth Tremors This list may not describe all possible side effects. Call your doctor for medical advice about side effects. You may report side effects to FDA at 1-800-FDA-1088. Where should I keep my medication? Keep out of the reach of children and pets. Store at room temperature between 20 and 25 degrees C (68 and 77 degrees F). Throw away any unused medication after the expiration date. NOTE: This sheet is a summary. It may not cover all possible information. If you have questions about this medicine, talk to your doctor, pharmacist, or health care provider.  2023 Elsevier/Gold Standard (2007-04-26 00:00:00)

## 2022-06-06 NOTE — Telephone Encounter (Signed)
Sent mychart msg informing pt of follow up made with Megan 

## 2022-06-06 NOTE — Telephone Encounter (Signed)
NP refill med video in December

## 2022-06-06 NOTE — Progress Notes (Signed)
[facility]    Provider:  Dr Jane Avila Requesting Provider: Renold Genta Cresenciano Lick, MD Primary Care Provider:  Elby Showers, MD  CC:  migraines  Virtual Visit via Video Note  I connected with Jane Avila on 06/06/22 at 11:00 AM EDT by a video enabled telemedicine application and verified that I am speaking with the correct person using two identifiers.  Location: Patient: home Provider: work   I discussed the limitations of evaluation and management by telemedicine and the availability of in person appointments. The patient expressed understanding and agreed to proceed.    Follow Up Instructions:    I discussed the assessment and treatment plan with the patient. The patient was provided an opportunity to ask questions and all were answered. The patient agreed with the plan and demonstrated an understanding of the instructions.   The patient was advised to call back or seek an in-person evaluation if the symptoms worsen or if the condition fails to improve as anticipated.  I provided over 20 minutes of non-face-to-face time during this encounter.   Jane Beam, MD   06/06/2022: Only one migraine since starting amitriptyline and was much milder. Feels great. She has never slept so well. Didn't even need to try the ubrelvy. We discussed her mri, was normal, reviewed images, she is very happy. Increase amitriptyline a little because she thought it was starting to wear off a little but sleeping great, migraines great.   MRI brain 03/29/2022: personally reviewed images and agree with findings FINDINGS:  The brain parenchyma shows no abnormal signal intensities.  No structural lesion, tumor or infarct is noted.  Diffusion-weighted imaging is negative for acute ischemia.  Subarachnoid spaces and ventricular system appear normal.  Cortical sulci and gyri show normal appearance.  Extra-axial brain structures are normal.  Calvarium shows no abnormalities.   Orbits appear unremarkable paranasal sinuses show mild mucosal thickening with slight deviation of nasal septum to the right.  Pituitary gland and cerebellar tonsils appear normal.  Visualized portion of the cervical spine shows no significant abnormalities.  Flow-voids of large vessels of intracranial circulation appear to be patent except posterior circulation flow-voids appear to be of diminutive caliber..  Postcontrast images do not result in abnormal areas of enhancement     IMPRESSION: Unremarkable MRI scan of the brain with and without contrast.  Patient complains of symptoms per HPI as well as the following symptoms: none . Pertinent negatives and positives per HPI. All others negative   HPI:  Jane Avila is a 52 y.o. female here as requested by Jane Showers, MD for headache.  She has a past medical history of polyphasia, class I obesity, generalized anxiety disorder, insomnia disorder, essential hypertension, vitamin D deficiency, migraine headaches, postpartum thyroiditis, ADHD without mention of hyperactivity, hypothyroidism and goiter.  I reviewed Dr. Verlene Avila notes, patient has migraine headaches, they seem to be more frequent recently not quite sure what triggers are other than situational stress and perhaps some sleep deprivation.  She was advised to keep a headache diary.  She was prescribed Maxalt and Zofran to take immediately at onset of migraine.  She was sent here for evaluation.  I do not see any brain imaging or any prior neurology visits in epic or "Care Everywhere". Per patient has headaches with her menstrual cycle and pregnancy, the migraines started again in the last 2 months, has had them 3-4 times since then, location is in the back of the head and temporal feels vision  is affected when the headache happens, has nausea as well.  GYN workup was negative.  She has had migraines since a child, intermittent hormonal related, 19 years ago during pregnancy she had  them badly, she gave birth and never had them, she has had 4-5 total in the last few months so an average of 2 a month, nothing has changed, she is not perimenopausal. They are different than before, now they are more debilitating, unilateral either side, has had vision changes of the right eye blurriness just in one eye and bad light sensitivity, also left side and occipital which she never had, never had vision changes. Throbbing/pounding, more severe than before though she may need to go to the ED, gets better with rest and nausea medication, she has not tried the rizatriptan, nausea, photophobia, hurts to move, she did not keep a migraine diary recently, these happen in the morning which never occurred before, can be worse supine in the morning. She has vomited. Has not had an eye exam. No symptoms or signs of sleep apnea. No other focal neurologic deficits, associated symptoms, inciting events or modifiable factors.  Medications tried: maxalt, ondansetron, imitrex, topiramate, losartan, OTC meds, amitriptyline   Reviewed notes, labs and imaging from outside physicians, which showed:  12/27/2021: tsh normal     Latest Ref Rng & Units 01/18/2022    9:19 AM 11/18/2019    8:55 AM 02/04/2019   12:30 PM  CBC  WBC 3.8 - 10.8 Thousand/uL 5.8  7.5  6.7   Hemoglobin 11.7 - 15.5 g/dL 13.8  14.7  14.5   Hematocrit 35.0 - 45.0 % 41.3  45.0  43.1   Platelets 140 - 400 Thousand/uL 342  346  311        Latest Ref Rng & Units 01/18/2022    9:19 AM 11/18/2019    8:55 AM 02/04/2019   12:30 PM  CMP  Glucose 65 - 99 mg/dL 76  95  83   BUN 7 - 25 mg/dL 13  12  12    Creatinine 0.50 - 1.03 mg/dL 0.74  0.89  0.81   Sodium 135 - 146 mmol/L 141  137  138   Potassium 3.5 - 5.3 mmol/L 4.6  4.2  4.3   Chloride 98 - 110 mmol/L 104  99  103   CO2 20 - 32 mmol/L 30  24  21    Calcium 8.6 - 10.4 mg/dL 9.2  9.3  9.0   Total Protein 6.1 - 8.1 g/dL 6.4  6.8  6.4   Total Bilirubin 0.2 - 1.2 mg/dL 0.5  0.6  0.5    Alkaline Phos 48 - 121 IU/L  76  80   AST 10 - 35 U/L 19  19  17    ALT 6 - 29 U/L 18  19  15       Review of Systems: Patient complains of symptoms per HPI as well as the following symptoms migrianes. Pertinent negatives and positives per HPI. All others negative.   Social History   Socioeconomic History   Marital status: Married    Spouse name: Not on file   Number of children: Not on file   Years of education: Not on file   Highest education level: Not on file  Occupational History   Not on file  Tobacco Use   Smoking status: Former    Packs/day: 0.03    Years: 4.00    Additional pack years: 0.00    Total pack years: 0.12  Types: Cigarettes    Quit date: 09/22/1998    Years since quitting: 23.7   Smokeless tobacco: Never  Vaping Use   Vaping Use: Never used  Substance and Sexual Activity   Alcohol use: Yes    Comment: social   Drug use: No   Sexual activity: Yes    Birth control/protection: Surgical  Other Topics Concern   Not on file  Social History Narrative   Caffiene 1 cup daily, soda no   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   Social Determinants of Health   Financial Resource Strain: Not on file  Food Insecurity: Not on file  Transportation Needs: Not on file  Physical Activity: Not on file  Stress: Not on file  Social Connections: Not on file  Intimate Partner Violence: Not on file    Family History  Problem Relation Age of Onset   Migraines Mother    Hypertension Mother    Hyperlipidemia Mother    Thyroid disease Mother    Obesity Mother    Diabetes Father    Hypertension Father    Hyperlipidemia Father    Heart disease Father    Cancer Father    Bipolar disorder Father    Obesity Father     Past Medical History:  Diagnosis Date   Fibroid    Hypertension    Hypothyroidism    Migraine    Thyroid disease     Patient Active Problem List   Diagnosis Date Noted   Migraine without aura and  without status migrainosus, not intractable 02/28/2022   Polyphagia 11/02/2019   Class 1 obesity with serious comorbidity and body mass index (BMI) of 30.0 to 30.9 in adult 02/05/2019   Generalized anxiety disorder 03/13/2016   Insomnia disorder 03/13/2016   Essential hypertension 02/06/2016   Vitamin D deficiency 07/09/2014   History of migraine headaches 07/09/2014   Postpartum thyroiditis 07/09/2014   Attention deficit disorder without mention of hyperactivity 10/15/2012   Hypothyroidism 09/22/2010   Goiter 09/22/2010    Past Surgical History:  Procedure Laterality Date   BILATERAL SALPINGECTOMY Bilateral 03/01/2014   Procedure: BILATERAL SALPINGECTOMY;  Surgeon: Lovenia Kim, MD;  Location: [facility];  Service: Gynecology;  Laterality: Bilateral;   DILATION AND CURETTAGE OF UTERUS     KNEE ARTHROSCOPY     1991   ROBOTIC ASSISTED TOTAL HYSTERECTOMY N/A 03/01/2014   Procedure: ROBOTIC ASSISTED TOTAL HYSTERECTOMY;  Surgeon: Lovenia Kim, MD;  Location: [facility];  Service: Gynecology;  Laterality: N/A;   TONSILLECTOMY      Current Outpatient Medications  Medication Sig Dispense Refill   amitriptyline (ELAVIL) 25 MG tablet Take 1.5-2 tablets (37.5-50 mg total) by mouth at bedtime. 90 tablet 4   Rimegepant Sulfate (NURTEC) 75 MG TBDP Take 75 mg by mouth daily as needed. For migraines. Take as close to onset of migraine as possible. One daily maximum. 4 tablet 0   rizatriptan (MAXALT) 10 MG tablet Take 1 tablet (10 mg total) by mouth as needed for migraine. May repeat in 2 hours if needed 10 tablet 1   tirzepatide (MOUNJARO) 15 MG/0.5ML Pen Inject 15 mg into the skin once a week. 2 mL 3   tirzepatide (MOUNJARO) 7.5 MG/0.5ML Pen Inject 7.5 mg into the skin once a week. 2 mL 1   Ubrogepant (UBRELVY) 100 MG TABS Take 100 mg by mouth every 2 (two) hours as needed. Maximum 200mg  a day. 2 tablet 0   No  current facility-administered medications for this visit.    Allergies as of  06/06/2022 - Review Complete 05/30/2022  Allergen Reaction Noted   Erythromycin Anaphylaxis 09/22/2010   Tetracyclines & related Anaphylaxis 09/22/2010   Sulfa antibiotics Rash 09/22/2010    Vitals: There were no vitals taken for this visit. Last Weight:  Wt Readings from Last 1 Encounters:  05/30/22 160 lb (72.6 kg)   Last Height:   Ht Readings from Last 1 Encounters:  05/30/22 5\' 4"  (1.626 m)    Physical exam: Exam: Gen: NAD, conversant      CV: attempted, Could not perform over Web Video. Denies palpitations or chest pain or SOB. VS: Breathing at a normal rate. Not febrile. Eyes: Conjunctivae clear without exudates or hemorrhage  Neuro: Detailed Neurologic Exam  Speech:    Speech is normal; fluent and spontaneous with normal comprehension.  Cognition:    The patient is oriented to person, place, and time;     recent and remote memory intact;     language fluent;     normal attention, concentration,     fund of knowledge Cranial Nerves:    The pupils are equal, round, and reactive to light. Cannot perform fundoscopic exam. Visual fields are full to finger confrontation. Extraocular movements are intact.  The face is symmetric with normal sensation. The palate elevates in the midline. Hearing intact. Voice is normal. Shoulder shrug is normal. The tongue has normal motion without fasciculations.   Coordination:    Normal finger to nose  Gait:    Normal native gait  Motor Observation:   no involuntary movements noted. Tone:    Appears normal  Posture:    Posture is normal. normal erect    Strength:    Strength is anti-gravity and symmetric in the upper and lower limbs.      Sensation: intact to LT           Assessment/Plan:  Patient with migraines doing great on amitriptyline  MRI brain: normal  - Need a recent eye exam: Dr. Katy Fitch, will see her back in 3 months to recheck but thinks it is congenital and he will follow and since the headaches are so  improved very unlikely any eye pathology  - As needed/emergent: At onset of headache: Rizatriptan for migraine and ondansetron for nausea, can take them together and then repeat in 2 hours if needed. Other medications could consider are the new medications Nurtec, Ubrelvy. Hasn't even needed to use any.   - Prevention: since episodic, usually 2 a month wouldn't definitively start preventative but low threshold if they get worse - she has tried topiramate, doing well on amitriptyline for insomnia and migraines.  if blood pressure is elevated(take readings at home) could try propranolol as well and then go to the newer medications, discussed the new class of medications such as qulipta, Ajovy and Emgality, but since doing so well on Amitriptyline stay on it, will increase to 50 if too much take 1.5 pills instead of 2 x 25mg   - blood pressure slightly elevated last time, monitor at home several times a day - sees dr Hollace Hayward for weight, checking with her  - Trazodone not working not taking, continue amitriptyline helping with migraines and sleep  - samples ubrelvy: Please take one tablet at the onset of your headache. If it does not improve the symptoms please take one additional tablet. If she likes it we can prescribe. Hasn't even needed to try one -samples nurtec once daily as  needed, if she likes it we can prescribe, hasn;t even needed to try one - she is 51 cannot rule out changes in hormones causing worsening headaches may consider estrogen patch or nuva ring with period every 3 months if worsen - Will refill meds with NP in December, if ANYTHINg changes reschedule with Dr. Jaynee Avila, sent by Dr. Renold Genta and Dr. Estanislado Pandy  Meds ordered this encounter  Medications   DISCONTD: amitriptyline (ELAVIL) 50 MG tablet    Sig: Take 1 tablet (50 mg total) by mouth at bedtime.    Dispense:  90 tablet    Refill:  11   amitriptyline (ELAVIL) 25 MG tablet    Sig: Take 1.5-2 tablets (37.5-50 mg total) by mouth at  bedtime.    Dispense:  90 tablet    Refill:  4    Please discontinue prior amitriptyline prescriptions she will use this one.    Cc: Jane Showers, MD  Sarina Ill, MD  [facility] [address] [place], [place] [address]  Phone [phone] Fax [phone]

## 2022-06-07 DIAGNOSIS — F5081 Binge eating disorder: Secondary | ICD-10-CM | POA: Diagnosis not present

## 2022-06-07 DIAGNOSIS — I739 Peripheral vascular disease, unspecified: Secondary | ICD-10-CM | POA: Diagnosis not present

## 2022-06-07 DIAGNOSIS — Z8639 Personal history of other endocrine, nutritional and metabolic disease: Secondary | ICD-10-CM | POA: Diagnosis not present

## 2022-06-08 ENCOUNTER — Other Ambulatory Visit (HOSPITAL_COMMUNITY): Payer: Self-pay

## 2022-06-08 MED ORDER — ZEPBOUND 15 MG/0.5ML ~~LOC~~ SOAJ
15.0000 mg | SUBCUTANEOUS | 11 refills | Status: DC
  Filled 2022-06-08 – 2022-06-18 (×2): qty 2, 28d supply, fill #0
  Filled 2022-08-21: qty 2, 28d supply, fill #1
  Filled 2022-10-24: qty 2, 28d supply, fill #2
  Filled 2022-12-05: qty 2, 28d supply, fill #3
  Filled 2023-01-06: qty 2, 28d supply, fill #4

## 2022-06-11 ENCOUNTER — Other Ambulatory Visit: Payer: Self-pay

## 2022-06-11 ENCOUNTER — Other Ambulatory Visit (HOSPITAL_COMMUNITY): Payer: Self-pay

## 2022-06-11 MED ORDER — LISDEXAMFETAMINE DIMESYLATE 20 MG PO CAPS
20.0000 mg | ORAL_CAPSULE | Freq: Every morning | ORAL | 0 refills | Status: DC
  Filled 2022-06-11 – 2022-06-18 (×2): qty 30, 30d supply, fill #0

## 2022-06-13 ENCOUNTER — Other Ambulatory Visit (HOSPITAL_COMMUNITY): Payer: Self-pay

## 2022-06-18 ENCOUNTER — Other Ambulatory Visit (HOSPITAL_COMMUNITY): Payer: Self-pay

## 2022-06-18 ENCOUNTER — Other Ambulatory Visit: Payer: Self-pay

## 2022-07-05 DIAGNOSIS — H5310 Unspecified subjective visual disturbances: Secondary | ICD-10-CM | POA: Diagnosis not present

## 2022-07-11 DIAGNOSIS — R632 Polyphagia: Secondary | ICD-10-CM | POA: Diagnosis not present

## 2022-07-11 DIAGNOSIS — G43009 Migraine without aura, not intractable, without status migrainosus: Secondary | ICD-10-CM | POA: Diagnosis not present

## 2022-07-11 DIAGNOSIS — Z8639 Personal history of other endocrine, nutritional and metabolic disease: Secondary | ICD-10-CM | POA: Diagnosis not present

## 2022-07-18 ENCOUNTER — Institutional Professional Consult (permissible substitution): Payer: BC Managed Care – PPO | Admitting: Cardiovascular Disease

## 2022-07-26 ENCOUNTER — Other Ambulatory Visit (HOSPITAL_COMMUNITY): Payer: Self-pay

## 2022-08-01 ENCOUNTER — Ambulatory Visit: Payer: BC Managed Care – PPO | Admitting: Cardiovascular Disease

## 2022-08-08 DIAGNOSIS — G43019 Migraine without aura, intractable, without status migrainosus: Secondary | ICD-10-CM | POA: Diagnosis not present

## 2022-08-08 DIAGNOSIS — F5081 Binge eating disorder: Secondary | ICD-10-CM | POA: Diagnosis not present

## 2022-08-08 DIAGNOSIS — Z8639 Personal history of other endocrine, nutritional and metabolic disease: Secondary | ICD-10-CM | POA: Diagnosis not present

## 2022-08-08 DIAGNOSIS — I10 Essential (primary) hypertension: Secondary | ICD-10-CM | POA: Diagnosis not present

## 2022-08-17 ENCOUNTER — Other Ambulatory Visit (HOSPITAL_COMMUNITY): Payer: Self-pay

## 2022-08-21 ENCOUNTER — Other Ambulatory Visit (HOSPITAL_COMMUNITY): Payer: Self-pay

## 2022-08-22 ENCOUNTER — Other Ambulatory Visit (HOSPITAL_COMMUNITY): Payer: Self-pay

## 2022-08-29 DIAGNOSIS — Z79899 Other long term (current) drug therapy: Secondary | ICD-10-CM | POA: Diagnosis not present

## 2022-08-29 DIAGNOSIS — I10 Essential (primary) hypertension: Secondary | ICD-10-CM | POA: Diagnosis not present

## 2022-08-29 DIAGNOSIS — E038 Other specified hypothyroidism: Secondary | ICD-10-CM | POA: Diagnosis not present

## 2022-08-29 DIAGNOSIS — R632 Polyphagia: Secondary | ICD-10-CM | POA: Diagnosis not present

## 2022-09-19 ENCOUNTER — Ambulatory Visit: Payer: BC Managed Care – PPO | Attending: Cardiovascular Disease | Admitting: Cardiovascular Disease

## 2022-09-26 DIAGNOSIS — R632 Polyphagia: Secondary | ICD-10-CM | POA: Diagnosis not present

## 2022-09-26 DIAGNOSIS — Z8639 Personal history of other endocrine, nutritional and metabolic disease: Secondary | ICD-10-CM | POA: Diagnosis not present

## 2022-09-26 DIAGNOSIS — I10 Essential (primary) hypertension: Secondary | ICD-10-CM | POA: Diagnosis not present

## 2022-09-26 DIAGNOSIS — G43019 Migraine without aura, intractable, without status migrainosus: Secondary | ICD-10-CM | POA: Diagnosis not present

## 2022-10-04 NOTE — Progress Notes (Addendum)
Patient Care Team: Margaree Mackintosh, MD as PCP - General (Internal Medicine)  Visit Date: 10/18/22  Subjective:    Patient ID: Jane Avila , Female   DOB: 06/03/1970, 52 y.o.    MRN: [id number]   52 y.o. Female presents today for annual comprehensive physical exam.  History of hypertension treated with amlodipine 5 mg daily. Blood pressure normal today at 122/80.  Taking Zepbound 15 mg weekly for weight loss.  History of adult ADD treated with Vyvanse 20 mg daily.  History of migraine headaches treated with rizatriptan 10 mg as needed, Ubrelvy 100 mg every 2 hours as needed.  Status post total hysterectomy.   05/30/2022 blood work shows glucose elevated at 128. Kidney, liver functions normal. Electrolytes normal. Blood proteins normal. RBC elevated at 5.16. Hemoglobin elevated at 15.1. HCT elevated at 46.5. HGBA1c at 5.3% in 05/03/2022.  Mammogram last completed 01/10/22. No significant mammographic findings. Recommended repeat in 2024.  Reports negative Cologuard 2021. She will do a repeat this year.  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      Review of Systems  Constitutional:  Negative for chills, fever, malaise/fatigue and weight loss.  HENT:  Negative for hearing loss, sinus pain and sore throat.   Respiratory:  Negative for cough, hemoptysis and shortness of breath.   Cardiovascular:  Negative for  chest pain, palpitations, leg swelling and PND.  Gastrointestinal:  Negative for abdominal pain, constipation, diarrhea, heartburn, nausea and vomiting.  Genitourinary:  Negative for dysuria, frequency and urgency.  Musculoskeletal:  Negative for back pain, myalgias and neck pain.  Skin:  Negative for itching and rash.  Neurological:  Negative for dizziness, tingling, seizures and headaches.  Endo/Heme/Allergies:  Negative for polydipsia.  Psychiatric/Behavioral:  Negative for depression. The patient is not nervous/anxious.         Objective:   Vitals: BP 122/80 (BP Location: Right Arm, Patient Position: Sitting, Cuff Size: Large)   Pulse 68   Temp 97.8 F (36.6 C) (Tympanic)   Ht 5\' 4"  (1.626 m)   Wt 157 lb (71.2 kg)   SpO2 99%   BMI 26.95 kg/m    Physical Exam Vitals and nursing note reviewed.  Constitutional:      General: She is not in acute distress.    Appearance: Normal appearance. She is not ill-appearing or toxic-appearing.  HENT:     Head: Normocephalic and atraumatic.     Right Ear: Hearing, tympanic membrane, ear canal and external ear normal.     Left Ear: Hearing, tympanic membrane, ear canal and external ear normal.     Mouth/Throat:     Pharynx: Oropharynx is clear.  Eyes:     Extraocular Movements: Extraocular movements intact.     Pupils:  Pupils are equal, round, and reactive to light.  Neck:     Thyroid: No thyroid mass, thyromegaly or thyroid tenderness.     Vascular: No carotid bruit.  Cardiovascular:     Rate and Rhythm: Normal rate and regular rhythm. No extrasystoles are present.    Pulses:          Dorsalis pedis pulses are 1+ on the right side and 1+ on the left side.     Heart sounds: Normal heart sounds. No murmur heard.    No friction rub. No gallop.  Pulmonary:     Effort: Pulmonary effort is normal.     Breath sounds: Normal breath sounds. No decreased breath sounds, wheezing, rhonchi or rales.  Chest:     Chest wall: No mass.   Abdominal:     Palpations: Abdomen is soft. There is no hepatomegaly, splenomegaly or mass.     Tenderness: There is no abdominal tenderness.     Hernia: No hernia is present.  Musculoskeletal:     Cervical back: Normal range of motion.     Right lower leg: No edema.     Left lower leg: No edema.  Lymphadenopathy:     Cervical: No cervical adenopathy.     Upper Body:     Right upper body: No supraclavicular adenopathy.     Left upper body: No supraclavicular adenopathy.  Skin:    General: Skin is warm and dry.  Neurological:     General: No focal deficit present.     Mental Status: She is alert and oriented to person, place, and time. Mental status is at baseline.     Sensory: Sensation is intact.     Motor: Motor function is intact. No weakness.     Deep Tendon Reflexes: Reflexes are normal and symmetric.  Psychiatric:        Attention and Perception: Attention normal.        Mood and Affect: Mood normal.        Speech: Speech normal.        Behavior: Behavior normal.        Thought Content: Thought content normal.        Cognition and Memory: Cognition normal.        Judgment: Judgment normal.       Results:   Studies obtained and personally reviewed by me:  Mammogram last completed 01/10/22. No significant mammographic findings. Recommended repeat in 2024.  Labs:       Component Value Date/Time   NA 138 05/30/2022 0340   NA 137 11/18/2019 0855   K 4.1 05/30/2022 0340   CL 98 05/30/2022 0340   CO2 28 05/30/2022 0340   GLUCOSE 128 (H) 05/30/2022 0340   BUN 12 05/30/2022 0340   BUN 12 11/18/2019 0855   CREATININE 0.93 05/30/2022 0340   CREATININE 0.77 05/03/2022 1057   CALCIUM 9.5 05/30/2022 0340   PROT 7.8 05/30/2022 0340   PROT 6.8 11/18/2019 0855   ALBUMIN 4.7 05/30/2022 0340   ALBUMIN 4.6 11/18/2019 0855   AST 26 05/30/2022 0340   ALT 20 05/30/2022 0340   ALKPHOS 72 05/30/2022 0340   BILITOT 0.5 05/30/2022 0340   BILITOT 0.6 11/18/2019 0855    GFRNONAA >60 05/30/2022 0340   GFRNONAA >89 07/06/2014 0919   GFRAA 89 11/18/2019 0855   GFRAA >89 07/06/2014 0919     Lab Results  Component Value Date   WBC 8.6 05/30/2022   HGB 15.1 (H) 05/30/2022  HCT 46.5 (H) 05/30/2022   MCV 90.1 05/30/2022   PLT 384 05/30/2022    Lab Results  Component Value Date   CHOL 215 (H) 05/03/2022   HDL 90 05/03/2022   LDLCALC 111 (H) 05/03/2022   TRIG 57 05/03/2022   CHOLHDL 2.4 05/03/2022    Lab Results  Component Value Date   HGBA1C 5.3 05/03/2022     Lab Results  Component Value Date   TSH 1.91 05/03/2022      Assessment & Plan:   Hypertension: treated with amlodipine 5 mg daily. Blood pressure normal today at 122/80.  Taking Zepbound 15 mg weekly for weight loss.  Adult ADD: treated with Vyvanse 20 mg daily.  Migraine headaches: treated with rizatriptan 10 mg as needed, Ubrelvy 100 mg every 2 hours as needed.  Status post total hysterectomy.   Mammogram last completed 01/10/22. No significant mammographic findings. Recommended repeat in 2024.  Reports negative Cologuard 2021. She will do a repeat this year.  Vaccine counseling: UTD on tetanus vaccine. Suggested flu booster in the fall.  Return in 1 year for health maintenance exam or as needed.    I,Alexander Ruley,acting as a Neurosurgeon for Margaree Mackintosh, MD.,have documented all relevant documentation on the behalf of Margaree Mackintosh, MD,as directed by  Margaree Mackintosh, MD while in the presence of Margaree Mackintosh, MD.   I, Margaree Mackintosh, MD, have reviewed all documentation for this visit. The documentation on 11/03/22 for the exam, diagnosis, procedures, and orders are all accurate and complete.

## 2022-10-18 ENCOUNTER — Encounter: Payer: BC Managed Care – PPO | Admitting: Internal Medicine

## 2022-10-18 ENCOUNTER — Ambulatory Visit (INDEPENDENT_AMBULATORY_CARE_PROVIDER_SITE_OTHER): Payer: BC Managed Care – PPO | Admitting: Internal Medicine

## 2022-10-18 ENCOUNTER — Encounter: Payer: Self-pay | Admitting: Internal Medicine

## 2022-10-18 ENCOUNTER — Telehealth: Payer: Self-pay

## 2022-10-18 VITALS — BP 122/80 | HR 68 | Temp 97.8°F | Ht 64.0 in | Wt 157.0 lb

## 2022-10-18 DIAGNOSIS — E038 Other specified hypothyroidism: Secondary | ICD-10-CM | POA: Diagnosis not present

## 2022-10-18 DIAGNOSIS — F988 Other specified behavioral and emotional disorders with onset usually occurring in childhood and adolescence: Secondary | ICD-10-CM | POA: Diagnosis not present

## 2022-10-18 DIAGNOSIS — R739 Hyperglycemia, unspecified: Secondary | ICD-10-CM

## 2022-10-18 DIAGNOSIS — E063 Autoimmune thyroiditis: Secondary | ICD-10-CM

## 2022-10-18 DIAGNOSIS — Z0000 Encounter for general adult medical examination without abnormal findings: Secondary | ICD-10-CM

## 2022-10-18 DIAGNOSIS — Z8669 Personal history of other diseases of the nervous system and sense organs: Secondary | ICD-10-CM

## 2022-10-18 DIAGNOSIS — R7302 Impaired glucose tolerance (oral): Secondary | ICD-10-CM

## 2022-10-18 DIAGNOSIS — Z6826 Body mass index (BMI) 26.0-26.9, adult: Secondary | ICD-10-CM

## 2022-10-18 DIAGNOSIS — I10 Essential (primary) hypertension: Secondary | ICD-10-CM

## 2022-10-18 NOTE — Telephone Encounter (Signed)
Requested pap smear results from Olivia Mackie, MD office. I left a message for Estella Husk to fax over records to [phone]

## 2022-10-24 ENCOUNTER — Other Ambulatory Visit: Payer: Self-pay

## 2022-10-24 ENCOUNTER — Other Ambulatory Visit (HOSPITAL_COMMUNITY): Payer: Self-pay

## 2022-10-31 DIAGNOSIS — R632 Polyphagia: Secondary | ICD-10-CM | POA: Diagnosis not present

## 2022-10-31 DIAGNOSIS — E663 Overweight: Secondary | ICD-10-CM | POA: Diagnosis not present

## 2022-11-04 NOTE — Patient Instructions (Addendum)
Please have flu vaccine through employment. Please have mammogram and Cologard as discussed.Continue current meds and continue working with [facility] and continue follow up appts at [facility]. It was a pleasure to see you today.

## 2022-12-05 ENCOUNTER — Other Ambulatory Visit (HOSPITAL_COMMUNITY): Payer: Self-pay

## 2022-12-05 DIAGNOSIS — Z8639 Personal history of other endocrine, nutritional and metabolic disease: Secondary | ICD-10-CM | POA: Diagnosis not present

## 2022-12-05 DIAGNOSIS — R632 Polyphagia: Secondary | ICD-10-CM | POA: Diagnosis not present

## 2022-12-05 DIAGNOSIS — I10 Essential (primary) hypertension: Secondary | ICD-10-CM | POA: Diagnosis not present

## 2022-12-05 MED ORDER — SPIRONOLACTONE 50 MG PO TABS
ORAL_TABLET | ORAL | 2 refills | Status: DC
  Filled 2022-12-05: qty 90, 90d supply, fill #0
  Filled 2023-07-31: qty 30, 30d supply, fill #1

## 2022-12-06 ENCOUNTER — Other Ambulatory Visit (HOSPITAL_COMMUNITY): Payer: Self-pay

## 2023-01-02 ENCOUNTER — Other Ambulatory Visit (HOSPITAL_COMMUNITY): Payer: Self-pay

## 2023-01-02 DIAGNOSIS — R632 Polyphagia: Secondary | ICD-10-CM | POA: Diagnosis not present

## 2023-01-02 DIAGNOSIS — Z8639 Personal history of other endocrine, nutritional and metabolic disease: Secondary | ICD-10-CM | POA: Diagnosis not present

## 2023-01-02 DIAGNOSIS — I10 Essential (primary) hypertension: Secondary | ICD-10-CM | POA: Diagnosis not present

## 2023-01-02 DIAGNOSIS — E063 Autoimmune thyroiditis: Secondary | ICD-10-CM | POA: Diagnosis not present

## 2023-01-02 DIAGNOSIS — E038 Other specified hypothyroidism: Secondary | ICD-10-CM | POA: Diagnosis not present

## 2023-01-02 MED ORDER — ZEPBOUND 15 MG/0.5ML ~~LOC~~ SOAJ
15.0000 mg | SUBCUTANEOUS | 1 refills | Status: DC
  Filled 2023-01-02: qty 2, 28d supply, fill #0
  Filled 2023-02-02: qty 2, 28d supply, fill #1

## 2023-01-04 ENCOUNTER — Other Ambulatory Visit (HOSPITAL_COMMUNITY): Payer: Self-pay

## 2023-01-07 ENCOUNTER — Other Ambulatory Visit (HOSPITAL_COMMUNITY): Payer: Self-pay

## 2023-01-10 ENCOUNTER — Other Ambulatory Visit (HOSPITAL_COMMUNITY): Payer: Self-pay

## 2023-01-30 DIAGNOSIS — Z1231 Encounter for screening mammogram for malignant neoplasm of breast: Secondary | ICD-10-CM | POA: Diagnosis not present

## 2023-01-30 DIAGNOSIS — Z01419 Encounter for gynecological examination (general) (routine) without abnormal findings: Secondary | ICD-10-CM | POA: Diagnosis not present

## 2023-01-30 DIAGNOSIS — Z1331 Encounter for screening for depression: Secondary | ICD-10-CM | POA: Diagnosis not present

## 2023-01-30 LAB — HM MAMMOGRAPHY

## 2023-02-02 ENCOUNTER — Other Ambulatory Visit: Payer: Self-pay | Admitting: Internal Medicine

## 2023-02-02 ENCOUNTER — Other Ambulatory Visit (HOSPITAL_COMMUNITY): Payer: Self-pay

## 2023-02-04 ENCOUNTER — Other Ambulatory Visit (HOSPITAL_COMMUNITY): Payer: Self-pay

## 2023-02-04 ENCOUNTER — Other Ambulatory Visit: Payer: Self-pay

## 2023-02-04 MED ORDER — AMLODIPINE BESYLATE 5 MG PO TABS
5.0000 mg | ORAL_TABLET | Freq: Every day | ORAL | 1 refills | Status: DC
  Filled 2023-02-04: qty 30, 30d supply, fill #0

## 2023-02-07 DIAGNOSIS — Z1212 Encounter for screening for malignant neoplasm of rectum: Secondary | ICD-10-CM | POA: Diagnosis not present

## 2023-02-07 DIAGNOSIS — Z1211 Encounter for screening for malignant neoplasm of colon: Secondary | ICD-10-CM | POA: Diagnosis not present

## 2023-02-16 LAB — COLOGUARD: COLOGUARD: NEGATIVE

## 2023-02-16 LAB — EXTERNAL GENERIC LAB PROCEDURE: COLOGUARD: NEGATIVE

## 2023-02-26 ENCOUNTER — Telehealth: Payer: BC Managed Care – PPO | Admitting: Adult Health

## 2023-02-27 ENCOUNTER — Ambulatory Visit: Payer: BC Managed Care – PPO | Admitting: Adult Health

## 2023-02-27 ENCOUNTER — Encounter: Payer: Self-pay | Admitting: Adult Health

## 2023-02-27 VITALS — BP 127/84 | HR 84 | Ht 64.0 in | Wt 155.0 lb

## 2023-02-27 DIAGNOSIS — G43009 Migraine without aura, not intractable, without status migrainosus: Secondary | ICD-10-CM

## 2023-02-27 MED ORDER — AMITRIPTYLINE HCL 25 MG PO TABS: 25.0000 mg | ORAL_TABLET | Freq: Every day | ORAL | 0 refills | Status: DC

## 2023-02-27 NOTE — Progress Notes (Signed)
PATIENT: Jane Avila DOB: 1970/11/21  REASON FOR VISIT: follow up HISTORY FROM: patient PRIMARY NEUROLOGIST:   HISTORY OF PRESENT ILLNESS: Today 02/27/23  [name] is a 52 y.o. female who has been followed in this office for migraine headaches.  She reports that she is having a migraine every other month.  She took herself off of amitriptyline.  She states it was making her too sleepy the next day.  She states that she will now only take amitriptyline when she gets a migraine because it puts her to sleep.  She did not try Nurtec or Ubrelvy. Returns today for follow-up.   HISTORY 06/06/2022: Only one migraine since starting amitriptyline and was much milder. Feels great. She has never slept so well. Didn't even need to try the ubrelvy. We discussed her mri, was normal, reviewed images, she is very happy. Increase amitriptyline a little because she thought it was starting to wear off a little but sleeping great, migraines great.    MRI brain 03/29/2022: personally reviewed images and agree with findings FINDINGS:  The brain parenchyma shows no abnormal signal intensities.  No structural lesion, tumor or infarct is noted.  Diffusion-weighted imaging is negative for acute ischemia.  Subarachnoid spaces and ventricular system appear normal.  Cortical sulci and gyri show normal appearance.  Extra-axial brain structures are normal.  Calvarium shows no abnormalities.  Orbits appear unremarkable paranasal sinuses show mild mucosal thickening with slight deviation of nasal septum to the right.  Pituitary gland and cerebellar tonsils appear normal.  Visualized portion of the cervical spine shows no significant abnormalities.  Flow-voids of large vessels of intracranial circulation appear to be patent except posterior circulation flow-voids appear to be of diminutive caliber..  Postcontrast images do not result in abnormal areas of enhancement     IMPRESSION: Unremarkable MRI  scan of the brain with and without contrast.   Patient complains of symptoms per HPI as well as the following symptoms: none . Pertinent negatives and positives per HPI. All others negative     HPI:  [name] is a 52 y.o. female here as requested by Margaree Mackintosh, MD for headache.  She has a past medical history of polyphasia, class I obesity, generalized anxiety disorder, insomnia disorder, essential hypertension, vitamin D deficiency, migraine headaches, postpartum thyroiditis, ADHD without mention of hyperactivity, hypothyroidism and goiter.  I reviewed Dr. Beryle Quant notes, patient has migraine headaches, they seem to be more frequent recently not quite sure what triggers are other than situational stress and perhaps some sleep deprivation.  She was advised to keep a headache diary.  She was prescribed Maxalt and Zofran to take immediately at onset of migraine.  She was sent here for evaluation.  I do not see any brain imaging or any prior neurology visits in epic or "Care Everywhere". Per patient has headaches with her menstrual cycle and pregnancy, the migraines started again in the last 2 months, has had them 3-4 times since then, location is in the back of the head and temporal feels vision is affected when the headache happens, has nausea as well.  GYN workup was negative.   She has had migraines since a child, intermittent hormonal related, 19 years ago during pregnancy she had them badly, she gave birth and never had them, she has had 4-5 total in the last few months so an average of 2 a month, nothing has changed, she is not perimenopausal. They are different than before, now they  are more debilitating, unilateral either side, has had vision changes of the right eye blurriness just in one eye and bad light sensitivity, also left side and occipital which she never had, never had vision changes. Throbbing/pounding, more severe than before though she may need to go to the ED, gets  better with rest and nausea medication, she has not tried the rizatriptan, nausea, photophobia, hurts to move, she did not keep a migraine diary recently, these happen in the morning which never occurred before, can be worse supine in the morning. She has vomited. Has not had an eye exam. No symptoms or signs of sleep apnea. No other focal neurologic deficits, associated symptoms, inciting events or modifiable factors.   Medications tried: maxalt, ondansetron, imitrex, topiramate, losartan, OTC meds, amitriptyline Yeah tried about metformin okay  REVIEW OF SYSTEMS: Out of a complete 14 system review of symptoms, the patient complains only of the following symptoms, and all other reviewed systems are negative.  ALLERGIES: Allergies  Allergen Reactions   Erythromycin Anaphylaxis   Tetracyclines & Related Anaphylaxis   Sulfa Antibiotics Rash    HOME MEDICATIONS: Outpatient Medications Prior to Visit  Medication Sig Dispense Refill   amLODipine (NORVASC) 5 MG tablet Take 1 tablet (5 mg total) by mouth daily. 30 tablet 1   Rimegepant Sulfate (NURTEC) 75 MG TBDP Take 75 mg by mouth daily as needed. For migraines. Take as close to onset of migraine as possible. One daily maximum. (Patient not taking: Reported on 10/18/2022) 4 tablet 0   rizatriptan (MAXALT) 10 MG tablet Take 1 tablet (10 mg total) by mouth as needed for migraine. May repeat in 2 hours if needed (Patient not taking: Reported on 10/18/2022) 10 tablet 1   spironolactone (ALDACTONE) 50 MG tablet Take 1 tablet by mouth once daily. 90 days 90 tablet 2   tirzepatide (MOUNJARO) 15 MG/0.5ML Pen Inject 15 mg into the skin once a week. (Patient not taking: Reported on 10/18/2022) 2 mL 3   tirzepatide (MOUNJARO) 7.5 MG/0.5ML Pen Inject 7.5 mg into the skin once a week. (Patient not taking: Reported on 10/18/2022) 2 mL 1   tirzepatide (ZEPBOUND) 15 MG/0.5ML Pen Inject 15 mg into the skin once a week. (Patient taking differently: Inject 10 mg into the  skin once a week.) 2 mL 11   tirzepatide (ZEPBOUND) 15 MG/0.5ML Pen Inject 15 mg into the skin once a week. 2 mL 1   Ubrogepant (UBRELVY) 100 MG TABS Take 100 mg by mouth every 2 (two) hours as needed. Maximum 200mg  a day. (Patient not taking: Reported on 10/18/2022) 2 tablet 0   No facility-administered medications prior to visit.    PAST MEDICAL HISTORY: Past Medical History:  Diagnosis Date   Fibroid    Hypertension    Hypothyroidism    Migraine    Thyroid disease     PAST SURGICAL HISTORY: Past Surgical History:  Procedure Laterality Date   BILATERAL SALPINGECTOMY Bilateral 03/01/2014   Procedure: BILATERAL SALPINGECTOMY;  Surgeon: Lenoard Aden, MD;  Location: [facility];  Service: Gynecology;  Laterality: Bilateral;   DILATION AND CURETTAGE OF UTERUS     KNEE ARTHROSCOPY     1991   ROBOTIC ASSISTED TOTAL HYSTERECTOMY N/A 03/01/2014   Procedure: ROBOTIC ASSISTED TOTAL HYSTERECTOMY;  Surgeon: Lenoard Aden, MD;  Location: [facility];  Service: Gynecology;  Laterality: N/A;   TONSILLECTOMY      FAMILY HISTORY: Family History  Problem Relation Age of Onset   Migraines Mother  Hypertension Mother    Hyperlipidemia Mother    Thyroid disease Mother    Obesity Mother    Diabetes Father    Hypertension Father    Hyperlipidemia Father    Heart disease Father    Cancer Father    Bipolar disorder Father    Obesity Father     SOCIAL HISTORY: Social History   Socioeconomic History   Marital status: Married    Spouse name: Not on file   Number of children: Not on file   Years of education: Not on file   Highest education level: Not on file  Occupational History   Not on file  Tobacco Use   Smoking status: Former    Current packs/day: 0.00    Average packs/day: (0.1 ttl pk-yrs)    Types: Cigarettes    Start date: 09/22/1994    Quit date: 09/22/1998    Years since quitting: 24.4   Smokeless tobacco: Never  Vaping Use   Vaping status: Never Used  Substance and  Sexual Activity   Alcohol use: Yes    Comment: social   Drug use: No   Sexual activity: Yes    Birth control/protection: Surgical  Other Topics Concern   Not on file  Social History Narrative   Caffiene 1 cup daily, soda no   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   Social Determinants of Health   Financial Resource Strain: Not on file  Food Insecurity: Not on file  Transportation Needs: Not on file  Physical Activity: Not on file  Stress: Not on file  Social Connections: Unknown (08/01/2021)   Received from [facility], [facility]   Social Network    Social Network: Not on file  Intimate Partner Violence: Unknown (06/23/2021)   Received from [facility], [facility]   HITS    Physically Hurt: Not on file    Insult or Talk Down To: Not on file    Threaten Physical Harm: Not on file    Scream or Curse: Not on file      PHYSICAL EXAM  Vitals:   02/27/23 1111  BP: 127/84  Pulse: 84  Weight: 155 lb (70.3 kg)  Height: 5\' 4"  (1.626 m)   Body mass index is 26.61 kg/m.  Generalized: Well developed, in no acute distress   Neurological examination  Mentation: Alert oriented to time, place, history taking. Follows all commands speech and language fluent Cranial nerve II-XII: Pupils were equal round reactive to light. Extraocular movements were full, visual field were full on confrontational test. Facial sensation and strength were normal. Uvula tongue midline. Head turning and shoulder shrug  were normal and symmetric. Motor: The motor testing reveals 5 over 5 strength of all 4 extremities. Good symmetric motor tone is noted throughout.  Sensory: Sensory testing is intact to soft touch on all 4 extremities. No evidence of extinction is noted.  Coordination: Cerebellar testing reveals good finger-nose-finger and heel-to-shin bilaterally.  Gait and station: Gait is normal.   DIAGNOSTIC DATA (LABS, IMAGING, TESTING) - I reviewed  patient records, labs, notes, testing and imaging myself where available.  Lab Results  Component Value Date   WBC 8.6 05/30/2022   HGB 15.1 (H) 05/30/2022   HCT 46.5 (H) 05/30/2022   MCV 90.1 05/30/2022   PLT 384 05/30/2022      Component Value Date/Time   NA 138 05/30/2022 0340   NA 137 11/18/2019 0855   K 4.1 05/30/2022 0340  CL 98 05/30/2022 0340   CO2 28 05/30/2022 0340   GLUCOSE 128 (H) 05/30/2022 0340   BUN 12 05/30/2022 0340   BUN 12 11/18/2019 0855   CREATININE 0.93 05/30/2022 0340   CREATININE 0.77 05/03/2022 1057   CALCIUM 9.5 05/30/2022 0340   PROT 7.8 05/30/2022 0340   PROT 6.8 11/18/2019 0855   ALBUMIN 4.7 05/30/2022 0340   ALBUMIN 4.6 11/18/2019 0855   AST 26 05/30/2022 0340   ALT 20 05/30/2022 0340   ALKPHOS 72 05/30/2022 0340   BILITOT 0.5 05/30/2022 0340   BILITOT 0.6 11/18/2019 0855   GFRNONAA >60 05/30/2022 0340   GFRNONAA >89 07/06/2014 0919   GFRAA 89 11/18/2019 0855   GFRAA >89 07/06/2014 0919   Lab Results  Component Value Date   CHOL 215 (H) 05/03/2022   HDL 90 05/03/2022   LDLCALC 111 (H) 05/03/2022   TRIG 57 05/03/2022   CHOLHDL 2.4 05/03/2022   Lab Results  Component Value Date   HGBA1C 5.3 05/03/2022   No results found for: "VITAMINB12" Lab Results  Component Value Date   TSH 1.91 05/03/2022      ASSESSMENT AND PLAN 52 y.o. year old female  has a past medical history of Fibroid, Hypertension, Hypothyroidism, Migraine, and Thyroid disease. here with:  1.  Migraine headaches  -Patient is currently taking amitriptyline as an abortive therapy.  She would like the lower dose--25 mg.  I sent in a new prescription today -I did encourage the patient to try Nurtec or Ubrelvy for abortive therapy as these typically work better to abort migraines.  Patient voiced understanding. -Follow-up in 1 year or sooner if needed     Butch Penny, MSN, NP-C 02/27/2023, 11:06 AM [facility] [address], [address] [place], [place] [address] [phone]

## 2023-03-05 ENCOUNTER — Other Ambulatory Visit (HOSPITAL_COMMUNITY): Payer: Self-pay

## 2023-03-06 ENCOUNTER — Other Ambulatory Visit (HOSPITAL_COMMUNITY): Payer: Self-pay

## 2023-03-06 MED ORDER — ZEPBOUND 15 MG/0.5ML ~~LOC~~ SOAJ
15.0000 mg | SUBCUTANEOUS | 0 refills | Status: AC
  Filled 2023-03-06: qty 2, 28d supply, fill #0

## 2023-03-07 ENCOUNTER — Other Ambulatory Visit (HOSPITAL_COMMUNITY): Payer: Self-pay

## 2023-03-21 ENCOUNTER — Other Ambulatory Visit: Payer: Self-pay | Admitting: Adult Health

## 2023-04-03 DIAGNOSIS — Z6825 Body mass index (BMI) 25.0-25.9, adult: Secondary | ICD-10-CM | POA: Diagnosis not present

## 2023-04-03 DIAGNOSIS — R632 Polyphagia: Secondary | ICD-10-CM | POA: Diagnosis not present

## 2023-04-03 DIAGNOSIS — E038 Other specified hypothyroidism: Secondary | ICD-10-CM | POA: Diagnosis not present

## 2023-04-03 DIAGNOSIS — E663 Overweight: Secondary | ICD-10-CM | POA: Diagnosis not present

## 2023-04-06 ENCOUNTER — Other Ambulatory Visit (HOSPITAL_COMMUNITY): Payer: Self-pay

## 2023-04-06 MED ORDER — ZEPBOUND 15 MG/0.5ML ~~LOC~~ SOAJ
15.0000 mg | SUBCUTANEOUS | 1 refills | Status: DC
  Filled 2023-04-06: qty 2, 28d supply, fill #0
  Filled 2023-05-15: qty 2, 28d supply, fill #1

## 2023-04-16 ENCOUNTER — Encounter (HOSPITAL_COMMUNITY): Payer: Self-pay

## 2023-04-16 ENCOUNTER — Other Ambulatory Visit (HOSPITAL_COMMUNITY): Payer: Self-pay

## 2023-04-26 ENCOUNTER — Other Ambulatory Visit: Payer: Self-pay

## 2023-04-26 MED ORDER — AMLODIPINE BESYLATE 5 MG PO TABS: 5.0000 mg | ORAL_TABLET | Freq: Every day | ORAL | 3 refills | Status: DC

## 2023-05-10 ENCOUNTER — Other Ambulatory Visit: Payer: Self-pay

## 2023-05-10 MED ORDER — AMLODIPINE BESYLATE 5 MG PO TABS: 5.0000 mg | ORAL_TABLET | Freq: Every day | ORAL | 3 refills | Status: DC

## 2023-05-15 ENCOUNTER — Other Ambulatory Visit (HOSPITAL_COMMUNITY): Payer: Self-pay

## 2023-05-30 DIAGNOSIS — R632 Polyphagia: Secondary | ICD-10-CM | POA: Diagnosis not present

## 2023-05-30 DIAGNOSIS — I10 Essential (primary) hypertension: Secondary | ICD-10-CM | POA: Diagnosis not present

## 2023-05-30 DIAGNOSIS — Z8639 Personal history of other endocrine, nutritional and metabolic disease: Secondary | ICD-10-CM | POA: Diagnosis not present

## 2023-05-30 DIAGNOSIS — E038 Other specified hypothyroidism: Secondary | ICD-10-CM | POA: Diagnosis not present

## 2023-06-22 ENCOUNTER — Other Ambulatory Visit (HOSPITAL_COMMUNITY): Payer: Self-pay

## 2023-06-25 ENCOUNTER — Other Ambulatory Visit (HOSPITAL_COMMUNITY): Payer: Self-pay

## 2023-06-25 MED ORDER — ZEPBOUND 15 MG/0.5ML ~~LOC~~ SOAJ
SUBCUTANEOUS | 1 refills | Status: DC
  Filled 2023-06-25: qty 2, 28d supply, fill #0
  Filled 2023-07-31: qty 2, 28d supply, fill #1

## 2023-06-28 ENCOUNTER — Other Ambulatory Visit (HOSPITAL_COMMUNITY): Payer: Self-pay

## 2023-06-29 ENCOUNTER — Other Ambulatory Visit (HOSPITAL_COMMUNITY): Payer: Self-pay

## 2023-07-18 DIAGNOSIS — H04123 Dry eye syndrome of bilateral lacrimal glands: Secondary | ICD-10-CM | POA: Diagnosis not present

## 2023-07-18 DIAGNOSIS — H5310 Unspecified subjective visual disturbances: Secondary | ICD-10-CM | POA: Diagnosis not present

## 2023-07-31 ENCOUNTER — Other Ambulatory Visit (HOSPITAL_COMMUNITY): Payer: Self-pay

## 2023-07-31 ENCOUNTER — Other Ambulatory Visit: Payer: Self-pay

## 2023-08-20 ENCOUNTER — Other Ambulatory Visit: Payer: Self-pay | Admitting: Medical Genetics

## 2023-08-22 DIAGNOSIS — E038 Other specified hypothyroidism: Secondary | ICD-10-CM | POA: Diagnosis not present

## 2023-08-22 DIAGNOSIS — I10 Essential (primary) hypertension: Secondary | ICD-10-CM | POA: Diagnosis not present

## 2023-08-22 DIAGNOSIS — G43009 Migraine without aura, not intractable, without status migrainosus: Secondary | ICD-10-CM | POA: Diagnosis not present

## 2023-08-26 ENCOUNTER — Other Ambulatory Visit (HOSPITAL_COMMUNITY): Payer: Self-pay

## 2023-08-26 MED ORDER — QULIPTA 60 MG PO TABS
60.0000 mg | ORAL_TABLET | Freq: Every day | ORAL | 3 refills | Status: DC
  Filled 2023-08-26: qty 90, 90d supply, fill #0
  Filled 2023-12-09: qty 90, 90d supply, fill #1

## 2023-08-30 ENCOUNTER — Other Ambulatory Visit (HOSPITAL_COMMUNITY): Payer: Self-pay

## 2023-09-03 ENCOUNTER — Other Ambulatory Visit (HOSPITAL_COMMUNITY): Payer: Self-pay

## 2023-09-04 ENCOUNTER — Other Ambulatory Visit (HOSPITAL_COMMUNITY): Payer: Self-pay

## 2023-09-04 MED ORDER — ZEPBOUND 15 MG/0.5ML ~~LOC~~ SOAJ
15.0000 mg | SUBCUTANEOUS | 1 refills | Status: DC
  Filled 2023-09-04: qty 2, 28d supply, fill #0
  Filled 2023-10-10: qty 2, 28d supply, fill #1

## 2023-09-05 ENCOUNTER — Other Ambulatory Visit (HOSPITAL_COMMUNITY): Payer: Self-pay

## 2023-10-10 ENCOUNTER — Other Ambulatory Visit (HOSPITAL_BASED_OUTPATIENT_CLINIC_OR_DEPARTMENT_OTHER): Payer: Self-pay

## 2023-10-17 ENCOUNTER — Other Ambulatory Visit: Payer: BC Managed Care – PPO

## 2023-10-17 DIAGNOSIS — E039 Hypothyroidism, unspecified: Secondary | ICD-10-CM | POA: Diagnosis not present

## 2023-10-17 DIAGNOSIS — R7302 Impaired glucose tolerance (oral): Secondary | ICD-10-CM

## 2023-10-17 DIAGNOSIS — E063 Autoimmune thyroiditis: Secondary | ICD-10-CM

## 2023-10-17 DIAGNOSIS — I10 Essential (primary) hypertension: Secondary | ICD-10-CM | POA: Diagnosis not present

## 2023-10-17 DIAGNOSIS — Z0000 Encounter for general adult medical examination without abnormal findings: Secondary | ICD-10-CM

## 2023-10-17 LAB — LIPID PANEL
Cholesterol: 205 mg/dL — ABNORMAL HIGH (ref <200)
HDL: 76 mg/dL (ref >=50)
LDL Cholesterol (Calc): 115 mg/dL — ABNORMAL HIGH
Non-HDL Cholesterol (Calc): 129 mg/dL (ref <130)
Total CHOL/HDL Ratio: 2.7 (calc) (ref <5.0)
Triglycerides: 62 mg/dL (ref <150)

## 2023-10-17 LAB — COMPLETE METABOLIC PANEL WITHOUT GFR
AG Ratio: 1.9 (calc) (ref 1.0–2.5)
ALT: 14 U/L (ref 6–29)
AST: 17 U/L (ref 10–35)
Albumin: 4.4 g/dL (ref 3.6–5.1)
Alkaline phosphatase (APISO): 65 U/L (ref 37–153)
BUN: 9 mg/dL (ref 7–25)
CO2: 30 mmol/L (ref 20–32)
Calcium: 9.5 mg/dL (ref 8.6–10.4)
Chloride: 102 mmol/L (ref 98–110)
Creat: 0.78 mg/dL (ref 0.50–1.03)
Globulin: 2.3 g/dL (ref 1.9–3.7)
Glucose, Bld: 77 mg/dL (ref 65–99)
Potassium: 4.2 mmol/L (ref 3.5–5.3)
Sodium: 140 mmol/L (ref 135–146)
Total Bilirubin: 0.7 mg/dL (ref 0.2–1.2)
Total Protein: 6.7 g/dL (ref 6.1–8.1)

## 2023-10-17 LAB — CBC WITH DIFFERENTIAL/PLATELET
Absolute Lymphocytes: 2142 {cells}/uL (ref 850–3900)
Absolute Monocytes: 623 {cells}/uL (ref 200–950)
Basophils Absolute: 70 {cells}/uL (ref 0–200)
Basophils Relative: 1 %
Eosinophils Absolute: 182 {cells}/uL (ref 15–500)
Eosinophils Relative: 2.6 %
HCT: 47 % — ABNORMAL HIGH (ref 35.0–45.0)
Hemoglobin: 14.9 g/dL (ref 11.7–15.5)
MCH: 29.1 pg (ref 27.0–33.0)
MCHC: 31.7 g/dL — ABNORMAL LOW (ref 32.0–36.0)
MCV: 91.8 fL (ref 80.0–100.0)
MPV: 9.4 fL (ref 7.5–12.5)
Monocytes Relative: 8.9 %
Neutro Abs: 3983 {cells}/uL (ref 1500–7800)
Neutrophils Relative %: 56.9 %
Platelets: 366 Thousand/uL (ref 140–400)
RBC: 5.12 Million/uL — ABNORMAL HIGH (ref 3.80–5.10)
RDW: 12.7 % (ref 11.0–15.0)
Total Lymphocyte: 30.6 %
WBC: 7 Thousand/uL (ref 3.8–10.8)

## 2023-10-17 LAB — TSH: TSH: 0.09 m[IU]/L — ABNORMAL LOW

## 2023-10-17 LAB — HEMOGLOBIN A1C
Hgb A1c MFr Bld: 5.1 % (ref <5.7)
Mean Plasma Glucose: 100 mg/dL
eAG (mmol/L): 5.5 mmol/L

## 2023-10-18 ENCOUNTER — Ambulatory Visit: Payer: Self-pay | Admitting: Internal Medicine

## 2023-10-23 NOTE — Progress Notes (Signed)
 Annual Wellness Visit   Patient Care Team: Perri Ronal PARAS, MD as PCP - General (Internal Medicine)  Visit Date: 10/24/23   Chief Complaint  Patient presents with   Annual Exam   Subjective:  Patient: Jane Avila, Female DOB: 01-26-1971, 53 y.o. MRN: [id number] Jane Avila is a 53 y.o. Female who presents today for her Annual Wellness Visit. Patient has Hypothyroidism; Goiter; Attention Deficit Disorder; Vitamin-D Deficiency; History of Migraine Headaches; Postpartum Thyroiditis; Essential Hypertension; Generalized Anxiety Disorder; Insomnia Disorder; Class 1 Obesity w/ Serious Comorbidity and Body Mass Index (BMI) 30.0-30.9; Polyphagia; and Migraine w/o Aura & w/o Status Migrainosus, Not Intractable.  History of Hypertension treated with Amlodipine  5 mg daily; Hx of Dependent Edema. Blood Pressure: normotensive today at 120/80.  History of BMI 25-30; since 10/2022, when she weighed 157 lbs, she has lost 9 pounds and today weighs 148 lbsBMI 25.40. Weight loss managed on Zepbound  15 mg injected weekly. Followed by Dr. Geni Shutter at [facility].    History of Hypothyroidism treated with Levothyroxine  88 mcg daily. 10/17/2023 TSH: 0.09. Followed by Dr. Warren Batty, Endocrinologist at [facility] office in [place]. She says that following her results, she informed Dr. Batty, who in response has lowered her Levothyroxine  to 88 mcg.  History of Migraine Headache treated with Amitriptyline  25 mg daily and Qulipta  60 mg daily  Labs 10/17/2023 CBC, compared to 05/2022: RBC 5.12, decreased from 5.16; HCT 47.0, elevated from 46.5; MCHC 31.7, decreased from 32.5.  C-MET: WNL Lipid Panel, compared to 04/2022: Cholesterol 205, decreased from 215; LDL 115, elevated from 111; otherwise WNL.  HgbA1c: WNL at 5.1  S/p TAH w/ BSO in 2015. Hormone Replacement Therapy with Estradiol patch twice weekly.  Mammogram 01/30/2023 Did not find any suspicious  masses, calcifications, or regions of architectural distortion; within dense tissue there are several small well-defined masses bilaterally, waxing/waning, suggesting benign etiologies, such as cysts with repeat recommendation of 2026.  Colo-guard 02/07/2023 negative with repeat recommendation of 2027.  Bone Density due 2037.    Vaccine Counseling: Due for Influenza, Covid-19, Shingrix 1st dose, PNA, and Hepatitis B - Discussed, she will update her Influenza and Covid-19 this fall; agreeable to Shingrix and PNA; regarding Hepatitis B, on review of an incident in 2012 surrounding possible HepC exposure, she did report being UTD on HepB series and did have a titer confirming immunity; UTD on Tdap.  Review of Systems  Constitutional:  Negative for chills, fever, malaise/fatigue and weight loss.  HENT:  Negative for hearing loss, sinus pain and sore throat.   Respiratory:  Negative for cough, hemoptysis and shortness of breath.   Cardiovascular:  Negative for chest pain, palpitations, leg swelling and PND.  Gastrointestinal:  Negative for abdominal pain, constipation, diarrhea, heartburn, nausea and vomiting.  Genitourinary:  Negative for dysuria, frequency and urgency.  Musculoskeletal:  Negative for back pain, myalgias and neck pain.  Skin:  Negative for itching and rash.  Neurological:  Negative for dizziness, tingling, seizures and headaches.  Endo/Heme/Allergies:  Negative for polydipsia.  Psychiatric/Behavioral:  Negative for depression. The patient is not nervous/anxious.    Objective:  Vitals: body mass index is 25.4 kg/m. Today's Vitals   10/24/23 1055  BP: 120/80  Weight: 148 lb (67.1 kg)  Height: 5' 4 (1.626 m)  PainSc: 0-No pain   Physical Exam Vitals and nursing note reviewed.  Constitutional:      General: She is not in acute distress.    Appearance: Normal appearance.  She is not ill-appearing or toxic-appearing.  HENT:     Head: Normocephalic and atraumatic.      Right Ear: Hearing, tympanic membrane, ear canal and external ear normal.     Left Ear: Hearing, tympanic membrane, ear canal and external ear normal.     Mouth/Throat:     Pharynx: Oropharynx is clear.  Eyes:     Extraocular Movements: Extraocular movements intact.     Pupils: Pupils are equal, round, and reactive to light.  Neck:     Thyroid : No thyroid  mass, thyromegaly or thyroid  tenderness.     Vascular: No carotid bruit.  Cardiovascular:     Rate and Rhythm: Normal rate and regular rhythm. No extrasystoles are present.    Pulses:          Dorsalis pedis pulses are 2+ on the right side and 2+ on the left side.     Heart sounds: Normal heart sounds. No murmur heard.    No friction rub. No gallop.  Pulmonary:     Effort: Pulmonary effort is normal.     Breath sounds: Normal breath sounds. No decreased breath sounds, wheezing, rhonchi or rales.  Chest:     Chest wall: No mass.  Abdominal:     Palpations: Abdomen is soft. There is no hepatomegaly, splenomegaly or mass.     Tenderness: There is no abdominal tenderness.     Hernia: No hernia is present.  Musculoskeletal:     Cervical back: Normal range of motion.     Right lower leg: No edema.     Left lower leg: No edema.  Lymphadenopathy:     Cervical: No cervical adenopathy.     Upper Body:     Right upper body: No supraclavicular adenopathy.     Left upper body: No supraclavicular adenopathy.  Skin:    General: Skin is warm and dry.  Neurological:     General: No focal deficit present.     Mental Status: She is alert and oriented to person, place, and time. Mental status is at baseline.     Sensory: Sensation is intact.     Motor: Motor function is intact. No weakness.     Deep Tendon Reflexes: Reflexes are normal and symmetric.  Psychiatric:        Attention and Perception: Attention normal.        Mood and Affect: Mood normal.        Speech: Speech normal.        Behavior: Behavior normal.        Thought Content:  Thought content normal.        Cognition and Memory: Cognition normal.        Judgment: Judgment normal.    Current Outpatient Medications  Medication Instructions   amitriptyline  (ELAVIL ) 25 mg, Oral, Daily at bedtime   amLODipine  (NORVASC ) 5 mg, Oral, Daily   estradiol (DOTTI) 0.075 MG/24HR 1 patch, 2 times weekly   Qulipta  60 mg, Oral, Daily   Synthroid  88 mcg, Daily before breakfast   Zepbound  15 mg, Subcutaneous, Weekly   Zepbound  15 mg, Subcutaneous, Weekly   Past Medical History:  Diagnosis Date   Fibroid    Hypertension    Hypothyroidism    Migraine    Thyroid  disease    Medical/Surgical History Narrative:  Allergic/Intolerant to:  Allergies  Allergen Reactions   Erythromycin Anaphylaxis   Tetracyclines & Related Anaphylaxis   Sulfa Antibiotics Rash   2012 - In June, was accidentally pricked  with a contaminated needle. There was work-up on Marrian and the other patient to determine whether there had been transfer of HIV or Hepatitis B/C - HIV was negative. At that time, she was UTD with confirmed immunity for Hepatitis B.  Past Surgical History:  Procedure Laterality Date   BILATERAL SALPINGECTOMY Bilateral 03/01/2014   Procedure: BILATERAL SALPINGECTOMY;  Surgeon: Charlie JINNY Flowers, MD;  Location: [facility];  Service: Gynecology;  Laterality: Bilateral;   DILATION AND CURETTAGE OF UTERUS     KNEE ARTHROSCOPY     1991   ROBOTIC ASSISTED TOTAL HYSTERECTOMY N/A 03/01/2014   Procedure: ROBOTIC ASSISTED TOTAL HYSTERECTOMY;  Surgeon: Charlie JINNY Flowers, MD;  Location: [facility];  Service: Gynecology;  Laterality: N/A;   TONSILLECTOMY     Family History  Problem Relation Age of Onset   Migraines Mother    Hypertension Mother    Hyperlipidemia Mother    Thyroid  disease Mother    Obesity Mother    Diabetes Father    Hypertension Father    Hyperlipidemia Father    Heart disease Father    Cancer Father    Bipolar disorder Father    Obesity Father    Family History  Narrative: Father w/ hx of Diabetes, Hypertension, Heart Disease, Hyperlipidemia; Skin Cancer; Bipolar Disorder, and Obesity Mother w/ hx of Migraines, Hypertension, Hyperlipidemia, Thyroid  Disease, and Obesity Social History   Social History Narrative   Caffiene 1 cup daily, soda no   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   Married. Non-smoker, social alcohol consumption.   Most Recent Social Determinants of Health (Including Hx of Tobacco, Alcohol, and Drug Use) SDOH Screenings   Depression (PHQ2-9): Low Risk  (10/24/2023)  Social Connections: Unknown (08/01/2021)   Received from [facility]  Tobacco Use: Medium Risk (10/24/2023)   Social History   Tobacco Use   Smoking status: Former    Current packs/day: 0.00    Average packs/day: (0.1 ttl pk-yrs)    Types: Cigarettes    Start date: 09/22/1994    Quit date: 09/22/1998    Years since quitting: 25.1   Smokeless tobacco: Never  Vaping Use   Vaping status: Never Used  Substance Use Topics   Alcohol use: Yes    Comment: social   Drug use: No   Most Recent Fall Risk Assessment:    01/11/2022    2:09 PM  Fall Risk   Falls in the past year? 0  Number falls in past yr: 0  Injury with Fall? 0  Risk for fall due to : No Fall Risks  Follow up Falls evaluation completed      Data saved with a previous flowsheet row definition   Most Recent Anxiety/Depression Screenings:    10/24/2023   11:10 AM 01/11/2022    2:09 PM  PHQ 2/9 Scores  PHQ - 2 Score 0 0   Most Recent Vision/Hearing Screenings:No results found. Results:  Studies Obtained And Personally Reviewed By Me:  S/p TAH w/ BSO in 2015.   Mammogram 01/30/2023 Did not find any suspicious masses, calcifications, or regions of architectural distortion; within dense tissue there are several small well-defined masses bilaterally, waxing/waning, suggesting benign etiologies, such as cysts with repeat recommendation of 2026.  Colo-guard  02/07/2023 negative with repeat recommendation of 2027.  Labs:  CBC w/ Differential Lab Results  Component Value Date   WBC 7.0 10/17/2023   RBC 5.12 (H) 10/17/2023   HGB 14.9 10/17/2023   HCT  47.0 (H) 10/17/2023   PLT 366 10/17/2023   MCV 91.8 10/17/2023   MCH 29.1 10/17/2023   MCHC 31.7 (L) 10/17/2023   RDW 12.7 10/17/2023   MPV 9.4 10/17/2023   LYMPHSABS 3.1 05/30/2022   MONOABS 0.8 05/30/2022   BASOSABS 70 10/17/2023    Comprehensive Metabolic Panel Lab Results  Component Value Date   NA 140 10/17/2023   K 4.2 10/17/2023   CL 102 10/17/2023   CO2 30 10/17/2023   GLUCOSE 77 10/17/2023   BUN 9 10/17/2023   CREATININE 0.78 10/17/2023   CALCIUM 9.5 10/17/2023   PROT 6.7 10/17/2023   ALBUMIN 4.7 05/30/2022   AST 17 10/17/2023   ALT 14 10/17/2023   ALKPHOS 72 05/30/2022   BILITOT 0.7 10/17/2023   EGFR 94 05/03/2022   GFRNONAA >60 05/30/2022   Lipid Panel  Lab Results  Component Value Date   CHOL 205 (H) 10/17/2023   HDL 76 10/17/2023   LDLCALC 115 (H) 10/17/2023   TRIG 62 10/17/2023   A1c Lab Results  Component Value Date   HGBA1C 5.1 10/17/2023    TSH Lab Results  Component Value Date   TSH 0.09 (L) 10/17/2023   Assessment & Plan:  Other Labs Reviewed today: CBC, compared to 05/2022: RBC 5.12, decreased from 5.16; HCT 47.0, elevated from 46.5; MCHC 31.7, decreased from 32.5.  C-MET: WNL Lipid Panel, compared to 04/2022: Cholesterol 205, decreased from 215; LDL 115, elevated from 111; otherwise WNL.  HgbA1c: WNL at 5.1  Hypertension treated with Amlodipine  5 mg daily; Hx of Dependent Edema. Blood Pressure: normotensive today at 120/80.  BMI 25-30; since 10/2022, when she weighed 157 lbs, she has lost 9 pounds and today weighs 148 lbsBMI 25.40. Weight loss managed on Zepbound  15 mg injected weekly. Followed by Dr. Geni Shutter at [facility].    Hypothyroidism treated with Levothyroxine  88 mcg daily. 10/17/2023 TSH: 0.09.  Followed by Dr. Warren Batty, Endocrinologist at [facility] office in [place]. She says that following her results, she informed Dr. Batty, who in response has lowered her Levothyroxine  to 88 mcg.  Migraine Headache treated with Amitriptyline  25 mg daily and Qulipta  60 mg daily  S/p TAH w/ BSO in 2015.   Hormone Replacement Therapy with Estradiol patch twice weekly.  Mammogram 01/30/2023 Did not find any suspicious masses, calcifications, or regions of architectural distortion; within dense tissue there are several small well-defined masses bilaterally, waxing/waning, suggesting benign etiologies, such as cysts with repeat recommendation of 2026.  Colo-guard 02/07/2023 negative with repeat recommendation of 2027.  Bone Density due 2037.    Vaccine Counseling: Due for Influenza, Covid-19, Shingrix 1st dose, PNA, and Hepatitis B; UTD on Tdap.   Discussed - Pneumococcal 20 vaccine given today in-office - and she will update her Influenza and Covid-19 this fall; agreeable to Shingrix; regarding Hepatitis B, on review of an incident in 2012 surrounding possible Hep C exposure, she did report being UTD on Hep B series and did have a titer confirming immunity.   Return in 52 weeks (on 10/22/2024) for annual labs, and then on 10/29/2024 for annual visit, or as needed.   Annual Wellness Visit done today including the all of the following: Reviewed patient's Family Medical History Reviewed patient's SDOH and reviewed tobacco, alcohol, and drug use.  Reviewed and updated list of patient's medical providers Assessment of cognitive impairment was done Assessed patient's functional ability Established a written schedule for health screening services Health Risk Assessent Completed and Reviewed  Discussed health benefits of physical activity, and encouraged her to engage in regular exercise appropriate for her age and condition.   I,Emily Lagle,acting as a Neurosurgeon for Ronal JINNY Hailstone, MD.,have documented  all relevant documentation on the behalf of Ronal JINNY Hailstone, MD,as directed by  Ronal JINNY Hailstone, MD while in the presence of Ronal JINNY Hailstone, MD.   I, Ronal JINNY Hailstone, MD, have reviewed all documentation for this visit. The documentation on 10/24/2023 for the exam, diagnosis, procedures, and orders are all accurate and complete.

## 2023-10-24 ENCOUNTER — Encounter: Payer: Self-pay | Admitting: Internal Medicine

## 2023-10-24 ENCOUNTER — Ambulatory Visit (INDEPENDENT_AMBULATORY_CARE_PROVIDER_SITE_OTHER): Payer: BC Managed Care – PPO | Admitting: Internal Medicine

## 2023-10-24 VITALS — BP 120/80 | Ht 64.0 in | Wt 148.0 lb

## 2023-10-24 DIAGNOSIS — Z23 Encounter for immunization: Secondary | ICD-10-CM

## 2023-10-24 DIAGNOSIS — Z0000 Encounter for general adult medical examination without abnormal findings: Secondary | ICD-10-CM

## 2023-10-24 DIAGNOSIS — E063 Autoimmune thyroiditis: Secondary | ICD-10-CM | POA: Diagnosis not present

## 2023-10-24 DIAGNOSIS — Z6825 Body mass index (BMI) 25.0-25.9, adult: Secondary | ICD-10-CM

## 2023-10-24 DIAGNOSIS — I10 Essential (primary) hypertension: Secondary | ICD-10-CM

## 2023-10-24 DIAGNOSIS — Z8669 Personal history of other diseases of the nervous system and sense organs: Secondary | ICD-10-CM

## 2023-11-04 ENCOUNTER — Encounter: Payer: Self-pay | Admitting: Internal Medicine

## 2023-11-04 ENCOUNTER — Other Ambulatory Visit (HOSPITAL_COMMUNITY): Payer: Self-pay

## 2023-11-04 MED ORDER — ZEPBOUND 15 MG/0.5ML ~~LOC~~ SOAJ
15.0000 mg | SUBCUTANEOUS | 1 refills | Status: AC
  Filled 2023-11-04 – 2023-11-19 (×2): qty 2, 28d supply, fill #0
  Filled 2023-12-26: qty 2, 28d supply, fill #1

## 2023-11-04 NOTE — Patient Instructions (Addendum)
 It was a pleasure to see you today. Labs are stable. Pneumococcal 20 vaccine given. Continue follow up with Endocrinologist. Hgb AIC is normal. Blood pressure is stable on current regimen. Return in one year or as needed.

## 2023-11-06 ENCOUNTER — Other Ambulatory Visit (HOSPITAL_COMMUNITY): Payer: Self-pay

## 2023-11-19 ENCOUNTER — Other Ambulatory Visit (HOSPITAL_COMMUNITY): Payer: Self-pay

## 2023-12-26 ENCOUNTER — Other Ambulatory Visit (HOSPITAL_COMMUNITY): Payer: Self-pay

## 2024-01-17 ENCOUNTER — Other Ambulatory Visit: Payer: Self-pay | Admitting: Medical Genetics

## 2024-01-17 DIAGNOSIS — Z006 Encounter for examination for normal comparison and control in clinical research program: Secondary | ICD-10-CM

## 2024-01-23 ENCOUNTER — Other Ambulatory Visit (HOSPITAL_COMMUNITY): Payer: Self-pay

## 2024-01-23 DIAGNOSIS — G43009 Migraine without aura, not intractable, without status migrainosus: Secondary | ICD-10-CM | POA: Diagnosis not present

## 2024-01-23 DIAGNOSIS — R632 Polyphagia: Secondary | ICD-10-CM | POA: Diagnosis not present

## 2024-01-23 DIAGNOSIS — I10 Essential (primary) hypertension: Secondary | ICD-10-CM | POA: Diagnosis not present

## 2024-01-23 DIAGNOSIS — E038 Other specified hypothyroidism: Secondary | ICD-10-CM | POA: Diagnosis not present

## 2024-01-28 ENCOUNTER — Other Ambulatory Visit (HOSPITAL_COMMUNITY): Payer: Self-pay

## 2024-01-28 MED ORDER — ZEPBOUND 15 MG/0.5ML ~~LOC~~ SOAJ
15.0000 mg | SUBCUTANEOUS | 3 refills | Status: AC
  Filled 2024-01-28 – 2024-01-30 (×2): qty 2, 28d supply, fill #0

## 2024-01-30 ENCOUNTER — Other Ambulatory Visit (HOSPITAL_BASED_OUTPATIENT_CLINIC_OR_DEPARTMENT_OTHER): Payer: Self-pay

## 2024-01-30 ENCOUNTER — Other Ambulatory Visit (HOSPITAL_COMMUNITY): Payer: Self-pay

## 2024-01-30 DIAGNOSIS — Z01419 Encounter for gynecological examination (general) (routine) without abnormal findings: Secondary | ICD-10-CM | POA: Diagnosis not present

## 2024-01-30 DIAGNOSIS — Z1231 Encounter for screening mammogram for malignant neoplasm of breast: Secondary | ICD-10-CM | POA: Diagnosis not present

## 2024-01-30 LAB — HM MAMMOGRAPHY

## 2024-02-06 ENCOUNTER — Encounter: Payer: Self-pay | Admitting: Internal Medicine

## 2024-02-06 ENCOUNTER — Telehealth: Payer: Self-pay | Admitting: Adult Health

## 2024-02-06 NOTE — Telephone Encounter (Signed)
 Pt called to cx appointment due to conflict with work, pt has been rescheduled for a my chart vv for 03-26-24(phone rep was advised by Heather, RN to offer based on pt's work schedule availability)   Pt understands that although there may be some limitations with this type of visit, we will take all precautions to reduce any security or privacy concerns.  Pt understands that this will be treated like an in office visit and we will file with pt's insurance, and there may be a patient responsible charge related to this service.  Pt confirmed to be in the state of [place] on date of appointment, has also confirmed no new issues

## 2024-02-26 ENCOUNTER — Ambulatory Visit: Payer: BC Managed Care – PPO | Admitting: Adult Health

## 2024-03-24 ENCOUNTER — Other Ambulatory Visit (HOSPITAL_COMMUNITY): Payer: Self-pay

## 2024-03-26 ENCOUNTER — Telehealth: Admitting: Adult Health

## 2024-03-26 ENCOUNTER — Encounter: Payer: Self-pay | Admitting: Adult Health

## 2024-03-26 ENCOUNTER — Other Ambulatory Visit: Payer: Self-pay | Admitting: Internal Medicine

## 2024-03-26 DIAGNOSIS — G43009 Migraine without aura, not intractable, without status migrainosus: Secondary | ICD-10-CM | POA: Diagnosis not present

## 2024-03-26 MED ORDER — NURTEC 75 MG PO TBDP: ORAL_TABLET | ORAL | 11 refills | Status: AC

## 2024-03-26 NOTE — Progress Notes (Addendum)
 "    PATIENT: Jane Avila DOB: February 07, 1971  REASON FOR VISIT: follow up HISTORY FROM: patient  Virtual Visit via Video Note  I connected with Jane Avila on 03/26/2024 at  2:45 PM EST by a video enabled telemedicine application located remotely at [facility] and verified that I am speaking with the correct person using two identifiers who was located at their own home in     I discussed the limitations of evaluation and management by telemedicine and the availability of in person appointments. The patient expressed understanding and agreed to proceed.   PATIENT: Jane Avila DOB: [birth year]  REASON FOR VISIT: follow up HISTORY FROM: patient  HISTORY OF PRESENT ILLNESS: Today 03/30/2024  Jane Avila is a 54 y.o. female with a history of migraine headaches. Returns today for follow-up.  She reports that she has been taking amitriptyline  as an abortive therapy.  She states that when she tried to take it nightly it made her too sleepy the next day.  She typically can take amitriptyline  and because it makes her sleepy her headache resolves.  She has never tried Nurtec or Ubrelvy  as an abortive therapy.  She returns today for an evaluation.  HISTORY 04/29/22   [name] is a 54 y.o. female who has been followed in this office for migraine headaches.  She reports that she is having a migraine every other month.  She took herself off of amitriptyline .  She states it was making her too sleepy the next day.  She states that she will now only take amitriptyline  when she gets a migraine because it puts her to sleep.  She did not try Nurtec or Ubrelvy . Returns today for follow-up.    HISTORY 06/06/2022: Only one migraine since starting amitriptyline  and was much milder. Feels great. She has never slept so well. Didn't even need to try the ubrelvy . We discussed her mri, was normal, reviewed images, she is very  happy. Increase amitriptyline  a little because she thought it was starting to wear off a little but sleeping great, migraines great.    MRI brain 03/29/2022: personally reviewed images and agree with findings FINDINGS:  The brain parenchyma shows no abnormal signal intensities.  No structural lesion, tumor or infarct is noted.  Diffusion-weighted imaging is negative for acute ischemia.  Subarachnoid spaces and ventricular system appear normal.  Cortical sulci and gyri show normal appearance.  Extra-axial brain structures are normal.  Calvarium shows no abnormalities.  Orbits appear unremarkable paranasal sinuses show mild mucosal thickening with slight deviation of nasal septum to the right.  Pituitary gland and cerebellar tonsils appear normal.  Visualized portion of the cervical spine shows no significant abnormalities.  Flow-voids of large vessels of intracranial circulation appear to be patent except posterior circulation flow-voids appear to be of diminutive caliber..  Postcontrast images do not result in abnormal areas of enhancement     IMPRESSION: Unremarkable MRI scan of the brain with and without contrast.   Patient complains of symptoms per HPI as well as the following symptoms: none . Pertinent negatives and positives per HPI. All others negative     HPI:  Jane Avila is a 54 y.o. female here as requested by Perri Ronal PARAS, MD for headache.  She has a past medical history of polyphasia, class I obesity, generalized anxiety disorder, insomnia disorder, essential hypertension, vitamin D  deficiency, migraine headaches, postpartum thyroiditis, ADHD without mention of hyperactivity, hypothyroidism and goiter.  I reviewed Dr.  Baxley's notes, patient has migraine headaches, they seem to be more frequent recently not quite sure what triggers are other than situational stress and perhaps some sleep deprivation.  She was advised to keep a headache diary.  She was prescribed Maxalt  and  Zofran  to take immediately at onset of migraine.  She was sent here for evaluation.  I do not see any brain imaging or any prior neurology visits in epic or Care Everywhere. Per patient has headaches with her menstrual cycle and pregnancy, the migraines started again in the last 2 months, has had them 3-4 times since then, location is in the back of the head and temporal feels vision is affected when the headache happens, has nausea as well.  GYN workup was negative.   She has had migraines since a child, intermittent hormonal related, 19 years ago during pregnancy she had them badly, she gave birth and never had them, she has had 4-5 total in the last few months so an average of 2 a month, nothing has changed, she is not perimenopausal. They are different than before, now they are more debilitating, unilateral either side, has had vision changes of the right eye blurriness just in one eye and bad light sensitivity, also left side and occipital which she never had, never had vision changes. Throbbing/pounding, more severe than before though she may need to go to the ED, gets better with rest and nausea medication, she has not tried the rizatriptan , nausea, photophobia, hurts to move, she did not keep a migraine diary recently, these happen in the morning which never occurred before, can be worse supine in the morning. She has vomited. Has not had an eye exam. No symptoms or signs of sleep apnea. No other focal neurologic deficits, associated symptoms, inciting events or modifiable factors.   Medications tried: maxalt , ondansetron , imitrex, topiramate , losartan , OTC meds, amitriptyline  Yeah tried about metformin okay    REVIEW OF SYSTEMS: Out of a complete 14 system review of symptoms, the patient complains only of the following symptoms, and all other reviewed systems are negative.  ALLERGIES: Allergies[1]  HOME MEDICATIONS: Outpatient Medications Prior to Visit  Medication Sig Dispense Refill    amitriptyline  (ELAVIL ) 25 MG tablet TAKE 1 TABLET BY MOUTH AT  BEDTIME 30 tablet 11   Atogepant  (QULIPTA ) 60 MG TABS Take 1 tablet (60 mg total) by mouth daily. 90 tablet 3   estradiol (DOTTI) 0.075 MG/24HR Place 1 patch onto the skin 2 (two) times a week.     SYNTHROID  88 MCG tablet Take 88 mcg by mouth daily before breakfast.     tirzepatide  (ZEPBOUND ) 15 MG/0.5ML Pen Inject 15 mg into the skin once a week. 2 mL 0   tirzepatide  (ZEPBOUND ) 15 MG/0.5ML Pen Inject 15 mg into the skin once a week. 2 mL 1   amLODipine  (NORVASC ) 5 MG tablet Take 1 tablet (5 mg total) by mouth daily. 90 tablet 3   No facility-administered medications prior to visit.    PAST MEDICAL HISTORY: Past Medical History:  Diagnosis Date   Fibroid    Hypertension    Hypothyroidism    Migraine    Thyroid  disease     PAST SURGICAL HISTORY: Past Surgical History:  Procedure Laterality Date   BILATERAL SALPINGECTOMY Bilateral 03/01/2014   Procedure: BILATERAL SALPINGECTOMY;  Surgeon: Charlie JINNY Flowers, MD;  Location: [facility];  Service: Gynecology;  Laterality: Bilateral;   DILATION AND CURETTAGE OF UTERUS     KNEE ARTHROSCOPY  1991   ROBOTIC ASSISTED TOTAL HYSTERECTOMY N/A 03/01/2014   Procedure: ROBOTIC ASSISTED TOTAL HYSTERECTOMY;  Surgeon: Charlie JINNY Flowers, MD;  Location: [facility];  Service: Gynecology;  Laterality: N/A;   TONSILLECTOMY      FAMILY HISTORY: Family History  Problem Relation Age of Onset   Migraines Mother    Hypertension Mother    Hyperlipidemia Mother    Thyroid  disease Mother    Obesity Mother    Diabetes Father    Hypertension Father    Hyperlipidemia Father    Heart disease Father    Cancer Father    Bipolar disorder Father    Obesity Father     SOCIAL HISTORY: Social History   Socioeconomic History   Marital status: Married    Spouse name: Not on file   Number of children: Not on file   Years of education: Not on file   Highest education level: Not on file  Occupational  History   Not on file  Tobacco Use   Smoking status: Former    Current packs/day: 0.00    Average packs/day: (0.1 ttl pk-yrs)    Types: Cigarettes    Start date: 09/22/1994    Quit date: 09/22/1998    Years since quitting: 25.5   Smokeless tobacco: Never  Vaping Use   Vaping status: Never Used  Substance and Sexual Activity   Alcohol use: Yes    Comment: social   Drug use: No   Sexual activity: Yes    Birth control/protection: Surgical  Other Topics Concern   Not on file  Social History Narrative   Caffiene 1 cup daily, soda no   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   Married. Non-smoker, social alcohol consumption.   Social Drivers of Health   Tobacco Use: Low Risk (01/02/2024)   Received from [facility]   Patient History    Smoking Tobacco Use: Never    Smokeless Tobacco Use: Never    Passive Exposure: Not on file  Recent Concern: Tobacco Use - Medium Risk (11/04/2023)   Patient History    Smoking Tobacco Use: Former    Smokeless Tobacco Use: Never    Passive Exposure: Not on Actuary Strain: Not on file  Food Insecurity: Low Risk (01/02/2024)   Received from [facility]   Epic    Within the past 12 months, you worried that your food would run out before you got money to buy more: Never true    Within the past 12 months, the food you bought just didn't last and you didn't have money to get more. : Never true  Transportation Needs: No Transportation Needs (01/02/2024)   Received from [facility]    In the past 12 months, has lack of reliable transportation kept you from medical appointments, meetings, work or from getting things needed for daily living? : No  Physical Activity: Not on file  Stress: Not on file  Social Connections: Unknown (08/01/2021)   Received from [facility]   Social Network    Social Network: Not on file  Intimate Partner Violence: Unknown (06/23/2021)   Received  from [facility]   HITS    Physically Hurt: Not on file    Insult or Talk Down To: Not on file    Threaten Physical Harm: Not on file    Scream or Curse: Not on file  Depression (PHQ2-9): Low Risk (10/24/2023)   Depression (PHQ2-9)  PHQ-2 Score: 0  Alcohol Screen: Not on file  Housing: Low Risk (01/02/2024)   Received from [facility]   Epic    What is your living situation today?: I have a steady place to live    Think about the place you live. Do you have problems with any of the following? Choose all that apply:: None/None on this list  Utilities: Low Risk (01/02/2024)   Received from [facility]   Utilities    In the past 12 months has the electric, gas, oil, or water company threatened to shut off services in your home? : No  Health Literacy: Not on file      PHYSICAL EXAM Generalized: Well developed, in no acute distress   Neurological examination  Mentation: Alert oriented to time, place, history taking. Follows all commands speech and language fluent Cranial nerve II-XII: Facial symmetry noted.   DIAGNOSTIC DATA (LABS, IMAGING, TESTING) - I reviewed patient records, labs, notes, testing and imaging myself where available.  Lab Results  Component Value Date   WBC 7.0 10/17/2023   HGB 14.9 10/17/2023   HCT 47.0 (H) 10/17/2023   MCV 91.8 10/17/2023   PLT 366 10/17/2023      Component Value Date/Time   NA 140 10/17/2023 1156   NA 137 11/18/2019 0855   K 4.2 10/17/2023 1156   CL 102 10/17/2023 1156   CO2 30 10/17/2023 1156   GLUCOSE 77 10/17/2023 1156   BUN 9 10/17/2023 1156   BUN 12 11/18/2019 0855   CREATININE 0.78 10/17/2023 1156   CALCIUM 9.5 10/17/2023 1156   PROT 6.7 10/17/2023 1156   PROT 6.8 11/18/2019 0855   ALBUMIN 4.7 05/30/2022 0340   ALBUMIN 4.6 11/18/2019 0855   AST 17 10/17/2023 1156   ALT 14 10/17/2023 1156   ALKPHOS 72 05/30/2022 0340   BILITOT 0.7 10/17/2023 1156   BILITOT 0.6 11/18/2019 0855   GFRNONAA >60 05/30/2022 0340    GFRNONAA >89 07/06/2014 0919   GFRAA 89 11/18/2019 0855   GFRAA >89 07/06/2014 0919   Lab Results  Component Value Date   CHOL 205 (H) 10/17/2023   HDL 76 10/17/2023   LDLCALC 115 (H) 10/17/2023   TRIG 62 10/17/2023   CHOLHDL 2.7 10/17/2023   Lab Results  Component Value Date   HGBA1C 5.1 10/17/2023   No results found for: [id number] Lab Results  Component Value Date   TSH 0.09 (L) 10/17/2023      ASSESSMENT AND PLAN 54 y.o. year old female  has a past medical history of Fibroid, Hypertension, Hypothyroidism, Migraine, and Thyroid  disease. here with:  1.  Migraine headaches  -Patient is currently taking amitriptyline  as an abortive therapy.  I advised that we should try a Nurtec for abortive therapy as amitriptyline  is usually meant to be taken daily.  She voiced understanding. - I sent a prescription in for Nurtec 75 mg to take at the onset of a migraine.  Only 1 tablet in 24 hours.  I reviewed potential side effects with the patient and provided information on her after visit summary. -Follow-up in 1 year or sooner if needed  Meds ordered this encounter  Medications   Rimegepant Sulfate (NURTEC) 75 MG TBDP    Sig: Take 1 tablet at the onset of a migraine.  Only 1 tablet in 24 hours.    Dispense:  8 tablet    Refill:  11    Supervising Provider:   YAN, YIJUN [phone]     Kenechukwu Eckstein  Sherryl, MSN, NP-C 03/30/2024, 3:47 PM [facility] [address] [place], [place] [address] [phone]     [1]  Allergies Allergen Reactions   Erythromycin Anaphylaxis   Tetracyclines & Related Anaphylaxis   Sulfa Antibiotics Rash   "

## 2024-03-30 NOTE — Telephone Encounter (Signed)
 Pt called to request to speak NP about My chart message the patient stated she has not picked up  medication and would like to discuss with Np

## 2024-04-02 NOTE — Addendum Note (Signed)
 Addended by: SHERRYL DUWAINE SQUIBB on: 04/02/2024 07:47 AM   Modules accepted: Orders

## 2024-04-02 NOTE — Progress Notes (Signed)
 To Clarify: patient is no longer on Qulipta .

## 2024-04-02 NOTE — Telephone Encounter (Signed)
 Noted

## 2024-09-24 ENCOUNTER — Telehealth: Admitting: Adult Health

## 2024-10-22 ENCOUNTER — Other Ambulatory Visit: Payer: Self-pay

## 2024-10-23 ENCOUNTER — Other Ambulatory Visit

## 2024-10-29 ENCOUNTER — Encounter: Payer: Self-pay | Admitting: Internal Medicine
